# Patient Record
Sex: Female | Born: 1985 | Race: White | Hispanic: No | Marital: Single | Smoking: Never smoker
Health system: Southern US, Community
[De-identification: ages and names within clinical notes are randomized; demographics above are authoritative.]

## PROBLEM LIST (undated history)

## (undated) DIAGNOSIS — I499 Cardiac arrhythmia, unspecified: Secondary | ICD-10-CM

---

## 2013-08-27 ENCOUNTER — Emergency Department (HOSPITAL_COMMUNITY): Payer: Self-pay

## 2013-08-27 ENCOUNTER — Encounter (HOSPITAL_COMMUNITY): Payer: Self-pay | Admitting: Emergency Medicine

## 2013-08-27 ENCOUNTER — Emergency Department (HOSPITAL_COMMUNITY)
Admission: EM | Admit: 2013-08-27 | Discharge: 2013-08-27 | Disposition: A | Discharge status: Home / Self Care | Payer: Self-pay | Attending: Emergency Medicine | Admitting: Emergency Medicine

## 2013-08-27 DIAGNOSIS — Z3202 Encounter for pregnancy test, result negative: Secondary | ICD-10-CM | POA: Insufficient documentation

## 2013-08-27 DIAGNOSIS — E876 Hypokalemia: Secondary | ICD-10-CM

## 2013-08-27 DIAGNOSIS — I493 Ventricular premature depolarization: Secondary | ICD-10-CM

## 2013-08-27 DIAGNOSIS — R002 Palpitations: Secondary | ICD-10-CM

## 2013-08-27 DIAGNOSIS — I4949 Other premature depolarization: Secondary | ICD-10-CM | POA: Insufficient documentation

## 2013-08-27 HISTORY — DX: Cardiac arrhythmia, unspecified: I49.9

## 2013-08-27 LAB — CBC WITH DIFFERENTIAL/PLATELET
Basophils Absolute: 0 10*3/uL (ref 0.0–0.1)
Basophils Relative: 0 % (ref 0–1)
Eosinophils Absolute: 0.2 10*3/uL (ref 0.0–0.7)
Eosinophils Relative: 2 % (ref 0–5)
HCT: 41.1 % (ref 36.0–46.0)
MCH: 26.9 pg (ref 26.0–34.0)
MCHC: 33.3 g/dL (ref 30.0–36.0)
MCV: 80.7 fL (ref 78.0–100.0)
Monocytes Absolute: 0.6 10*3/uL (ref 0.1–1.0)
RDW: 14.3 % (ref 11.5–15.5)

## 2013-08-27 LAB — BASIC METABOLIC PANEL
Calcium: 9.3 mg/dL (ref 8.4–10.5)
Creatinine, Ser: 0.7 mg/dL (ref 0.50–1.10)
GFR calc Af Amer: >90 mL/min (ref >90)

## 2013-08-27 LAB — URINALYSIS, ROUTINE W REFLEX MICROSCOPIC
Bilirubin Urine: NEGATIVE
Ketones, ur: 15 mg/dL — AB
Nitrite: NEGATIVE
Protein, ur: NEGATIVE mg/dL

## 2013-08-27 LAB — URINE MICROSCOPIC-ADD ON

## 2013-08-27 MED ORDER — POTASSIUM CHLORIDE CRYS ER 20 MEQ PO TBCR: 20.0000 meq | EXTENDED_RELEASE_TABLET | Freq: Every day | ORAL | Status: DC

## 2013-08-27 MED ORDER — POTASSIUM CHLORIDE CRYS ER 20 MEQ PO TBCR
40.0000 meq | EXTENDED_RELEASE_TABLET | Freq: Once | ORAL | Status: AC
  Administered 2013-08-27: 40 meq via ORAL
  Filled 2013-08-27: qty 2

## 2013-08-27 NOTE — ED Provider Notes (Cosign Needed)
CSN: [id number]     Arrival date & time 08/27/13  1746 History   First MD Initiated Contact with Patient 08/27/13 1754     Chief Complaint  Patient presents with  . Irregular Heart Beat   (Consider location/radiation/quality/duration/timing/severity/associated sxs/prior Treatment) HPI  Aasha Dina is a 27 y.o. female who is otherwise healthy, complaining of weakness and lightheaded sensation while driving earlier in the day. Associated with the irregular heartbeat sensation she has had this in the past she states that she has been to the emergency room and had an EKG but she was told it was nothing serious. Patient states that she has eaten very little today and had 2 cups of coffee. Patient denies any chest pain, shortness of breath, no family history of early cardiac death, melena, hematochezia, heavy menstruation, alcohol or illicit drug use.   Past Medical History  Diagnosis Date  . Irregular heart beat    History reviewed. No pertinent past surgical history. History reviewed. No pertinent family history. History  Substance Use Topics  . Smoking status: Not on file  . Smokeless tobacco: Not on file  . Alcohol Use: Not on file   OB History   Grav Para Term Preterm Abortions TAB SAB Ect Mult Living                 Review of Systems 10 systems reviewed and found to be negative, except as noted in the HPI  Allergies  Review of patient's allergies indicates not on file.  Home Medications  BP 122/69  Pulse 63  Temp(Src) 99.2 F (37.3 C) (Oral)  Resp 18  SpO2 100%  LMP 08/02/2013 Physical Exam  Nursing note and vitals reviewed. Constitutional: She is oriented to person, place, and time. She appears well-developed and well-nourished. No distress.  HENT:  Head: Normocephalic.  Mouth/Throat: Oropharynx is clear and moist.  Eyes: Conjunctivae and EOM are normal.  Cardiovascular: Normal rate and intact distal pulses.   Generally regular with ectopic beats  Pulmonary/Chest:  Effort normal and breath sounds normal. No stridor. No respiratory distress. She has no wheezes. She has no rales. She exhibits no tenderness.  Abdominal: Soft. Bowel sounds are normal.  Musculoskeletal: Normal range of motion.  Neurological: She is alert and oriented to person, place, and time.  Psychiatric: She has a normal mood and affect.    ED Course  Procedures (including critical care time) Labs Review Labs Reviewed  CBC WITH DIFFERENTIAL - Abnormal; Notable for the following:    WBC 10.9 (*)    Neutro Abs 8.2 (*)    All other components within normal limits  BASIC METABOLIC PANEL - Abnormal; Notable for the following:    Potassium 3.2 (*)    CO2 17 (*)    All other components within normal limits  URINALYSIS, ROUTINE W REFLEX MICROSCOPIC  TSH  POCT PREGNANCY, URINE   Imaging Review Dg Chest 2 View  08/27/2013   *RADIOLOGY REPORT*  Clinical Data: Shortness of breath and irregular heart beat  CHEST - 2 VIEW  Comparison: None.  Findings: Lungs clear.  Heart size and pulmonary vascularity are normal.  No bone lesions.  No adenopathy.  IMPRESSION: No abnormality noted.   Original Report Authenticated By: Bretta Bang, M.D.    Date: 08/28/2013  Rate: 78  Rhythm: normal sinus rhythm with PVC  QRS Axis: right  Intervals: normal  ST/T Wave abnormalities: normal  Conduction Disutrbances:none  Narrative Interpretation:   Old EKG Reviewed: none available  MDM   1. PVC (premature ventricular contraction)   2. Palpitations   3. Hypokalemia     Filed Vitals:   08/27/13 1958 08/27/13 1959 08/27/13 2001 08/27/13 2032  BP: 131/71 134/88 141/85 122/69  Pulse: 80 102 109 63  Temp:    99.2 F (37.3 C)  TempSrc:    Oral  Resp:    18  SpO2:    100%     Quina Wilbourne is a 27 y.o. female second source of palpitations and weakness earlier in the day. EKG shows sinus rhythm with occasional PVC. Blood work is unremarkable except for mild hypokalemia of 3.2. She will be  repleted orally with 40 mEq. Encourage patient to follow as an outpatient for primary care management of the these palpitations and possible Holter monitoring.  This is a shared visit with the attending physician who personally evaluated the patient and agrees with the care plan.   Pt is hemodynamically stable, appropriate for, and amenable to discharge at this time. Pt verbalized understanding and agrees with care plan. All questions answered. Outpatient follow-up and specific return precautions discussed.    Note: Portions of this report may have been transcribed using voice recognition software. Every effort was made to ensure accuracy; however, inadvertent computerized transcription errors may be present      Wynetta Emery, PA-C 08/28/13 0124

## 2013-08-27 NOTE — ED Provider Notes (Signed)
27 year old female presents with a complaint of intermittent palpitations over the course of the last several hours. She describes 2 discreet episodes where she felt like her heart skipped a beat and then there was a positive then it returned to normal after several seconds. She has no chest pain, no back pain, no swelling and denies any ingestion of stimulants.  On exam she is clear heart and lung sounds, occasional ectopy approximately every 30 seconds with a PVC on the monitor. Her EKG is consistent with her clinical exam. She has mild hypokalemia, this will be replaced and she can followup for outpatient Holter monitor testing as needed. She appears hemodynamically stable for discharge at this time. She has expressed her understanding for the indications for return.  Medical screening examination/treatment/procedure(s) were conducted as a shared visit with non-physician practitioner(s) and myself.  I personally evaluated the patient during the encounter.  Clinical Impression: PVC's     Vida Roller, MD 08/27/13 2038

## 2013-08-27 NOTE — ED Notes (Signed)
Patient transported to X-ray 

## 2013-08-27 NOTE — ED Notes (Signed)
Pt c/o dizzyness and weakness while drive. Per ems pt is in bigeminy has hx of irregular HR. No signs of distress.

## 2013-08-29 LAB — URINE CULTURE

## 2014-08-17 IMAGING — CR DG CHEST 2V
2 series · 2 of 2 positions shown · non-contrast
Comparison: None.

CLINICAL DATA: Shortness of breath and irregular heart beat

CHEST - 2 VIEW

[w chest pa]
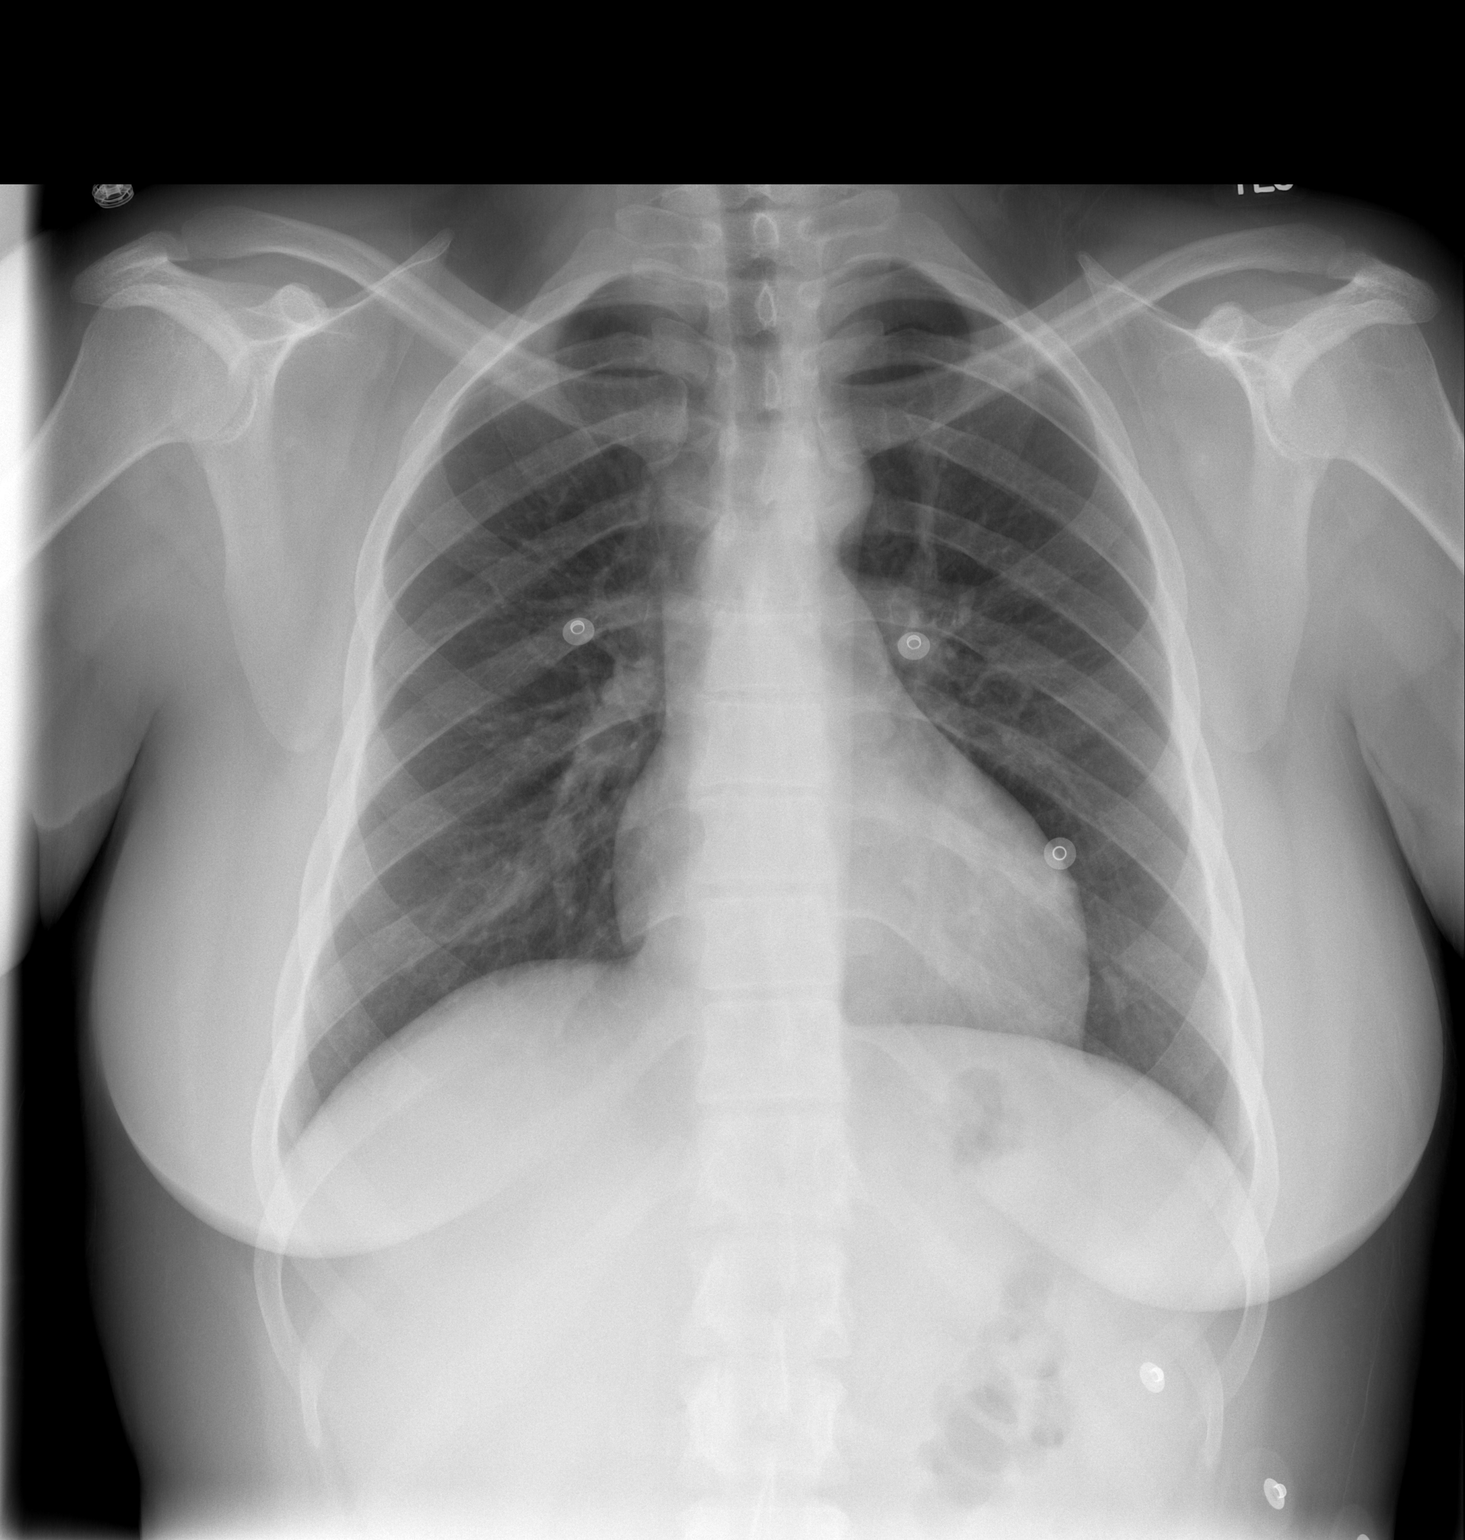

[w chest lat]
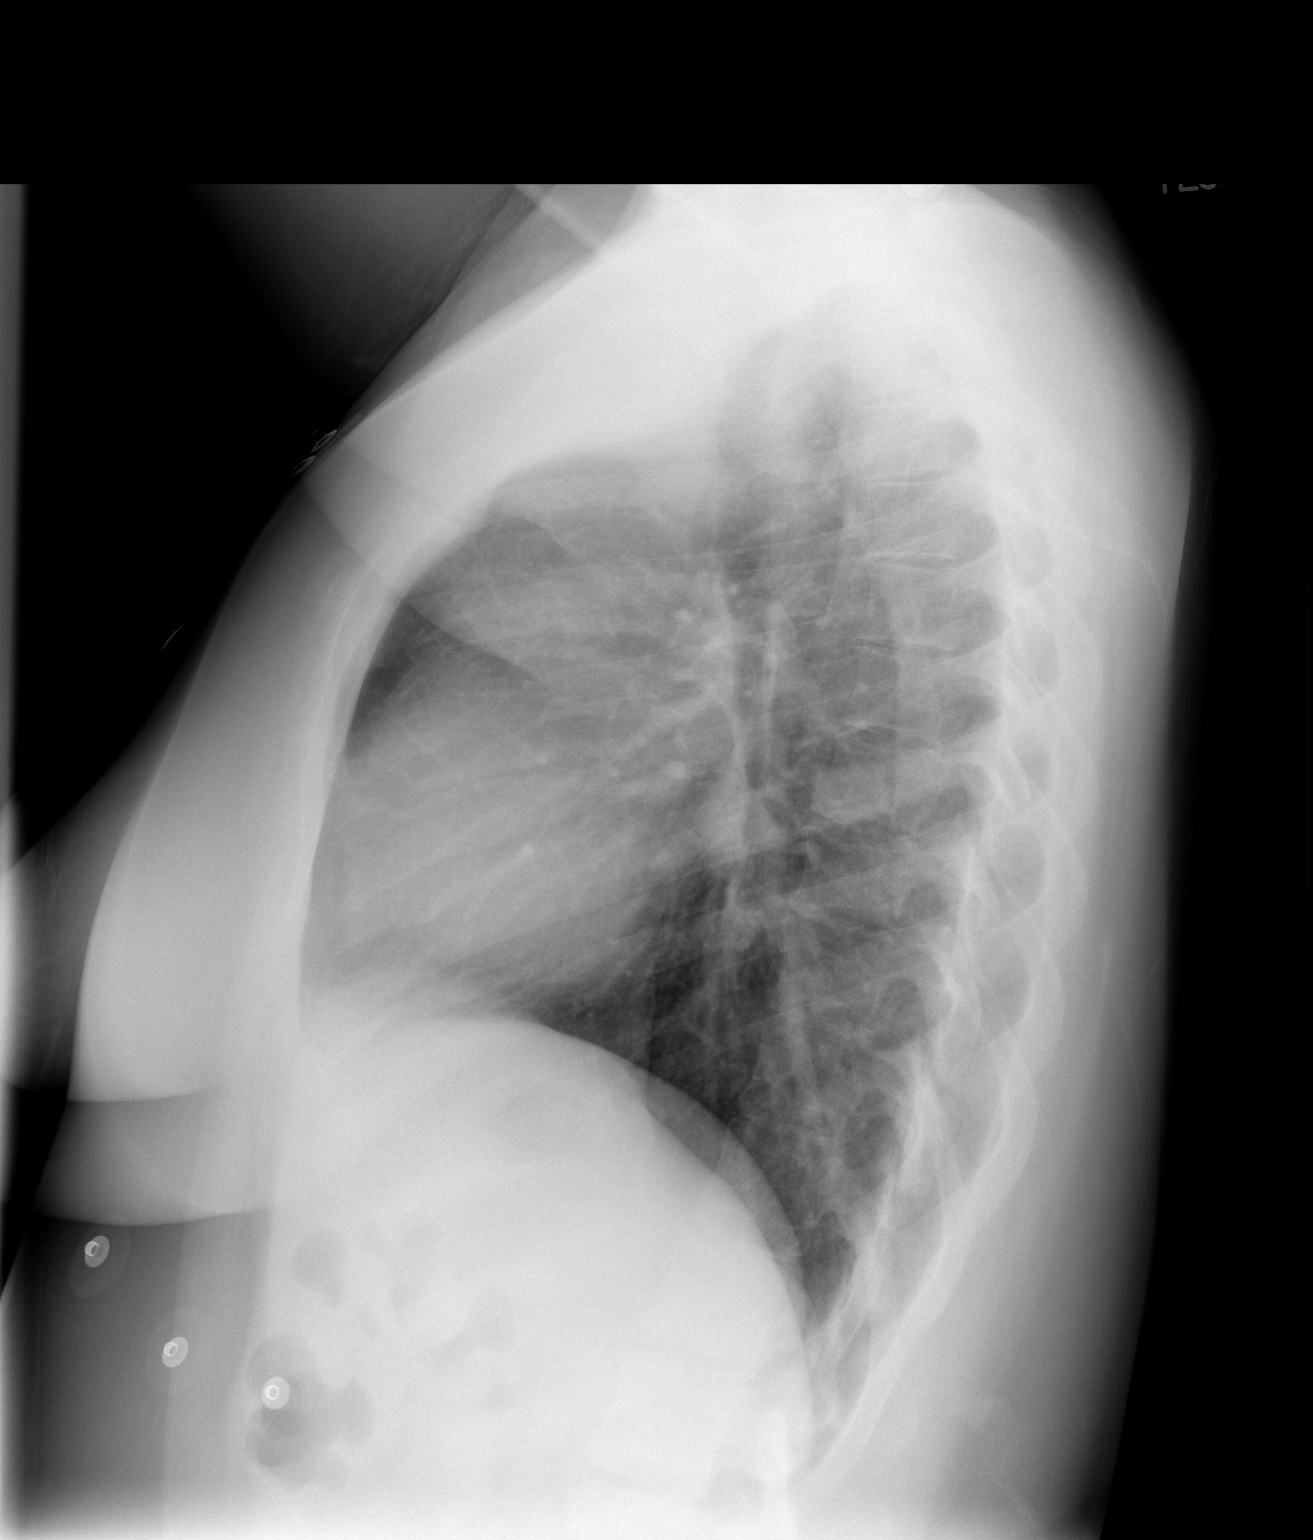

[2 of 2 positions shown; findings below may reference images not displayed]

FINDINGS: Lungs clear.  Heart size and pulmonary vascularity are
normal.  No bone lesions.  No adenopathy.
IMPRESSION: No abnormality noted.

## 2022-01-09 ENCOUNTER — Emergency Department (HOSPITAL_COMMUNITY)
Admission: EM | Admit: 2022-01-09 | Discharge: 2022-01-10 | Discharge status: Against Medical Advice | Payer: BC Managed Care – PPO | Attending: Emergency Medicine | Admitting: Emergency Medicine

## 2022-01-09 ENCOUNTER — Other Ambulatory Visit: Payer: Self-pay

## 2022-01-09 DIAGNOSIS — R42 Dizziness and giddiness: Secondary | ICD-10-CM | POA: Insufficient documentation

## 2022-01-09 DIAGNOSIS — R531 Weakness: Secondary | ICD-10-CM | POA: Insufficient documentation

## 2022-01-09 DIAGNOSIS — Z5321 Procedure and treatment not carried out due to patient leaving prior to being seen by health care provider: Secondary | ICD-10-CM | POA: Diagnosis not present

## 2022-01-09 LAB — URINALYSIS, ROUTINE W REFLEX MICROSCOPIC
Bilirubin Urine: NEGATIVE
Glucose, UA: NEGATIVE mg/dL
Hgb urine dipstick: NEGATIVE
Ketones, ur: 5 mg/dL — AB
Leukocytes,Ua: NEGATIVE
Nitrite: NEGATIVE
Protein, ur: NEGATIVE mg/dL
Specific Gravity, Urine: 1.003 — ABNORMAL LOW (ref 1.005–1.030)
pH: 7 (ref 5.0–8.0)

## 2022-01-09 LAB — BASIC METABOLIC PANEL
Anion gap: 9 (ref 5–15)
BUN: 13 mg/dL (ref 6–20)
CO2: 23 mmol/L (ref 22–32)
Calcium: 9.4 mg/dL (ref 8.9–10.3)
Chloride: 108 mmol/L (ref 98–111)
Creatinine, Ser: 0.97 mg/dL (ref 0.44–1.00)
GFR, Estimated: >60 mL/min (ref >60)
Glucose, Bld: 92 mg/dL (ref 70–99)
Potassium: 3.3 mmol/L — ABNORMAL LOW (ref 3.5–5.1)
Sodium: 140 mmol/L (ref 135–145)

## 2022-01-09 LAB — CBC
HCT: 44.3 % (ref 36.0–46.0)
Hemoglobin: 13.5 g/dL (ref 12.0–15.0)
MCH: 24.5 pg — ABNORMAL LOW (ref 26.0–34.0)
MCHC: 30.5 g/dL (ref 30.0–36.0)
MCV: 80.3 fL (ref 80.0–100.0)
Platelets: 293 10*3/uL (ref 150–400)
RBC: 5.52 MIL/uL — ABNORMAL HIGH (ref 3.87–5.11)
RDW: 14.4 % (ref 11.5–15.5)
WBC: 10.9 10*3/uL — ABNORMAL HIGH (ref 4.0–10.5)
nRBC: 0 % (ref 0.0–0.2)

## 2022-01-09 LAB — CBG MONITORING, ED: Glucose-Capillary: 91 mg/dL (ref 70–99)

## 2022-01-09 LAB — I-STAT BETA HCG BLOOD, ED (MC, WL, AP ONLY): I-stat hCG, quantitative: <5 m[IU]/mL (ref <5)

## 2022-01-09 NOTE — ED Notes (Signed)
Pt called for a room and stated to me that she was going to leave that she has already called her ride

## 2022-01-09 NOTE — ED Provider Triage Note (Signed)
Emergency Medicine Provider Triage Evaluation Note  Andrea Parrish , a 36 y.o. female  was evaluated in triage.  Pt complains of lightheadedness and dizziness. She states that same started today and she had a brief episode where she blacked out while driving earlier. She denies any prodrome proceeding her syncopal episode. Denies nausea, vomiting, or diarrhea. She is pain free. No cardiac hx. Does state that she has had hypertension for the past year but is not on medication  Review of Systems  Positive: Weakness, lightheadedness Negative: Headache, blurred vision, n/v/d  Physical Exam  BP 139/90 (BP Location: Left Arm)    Pulse 86    Temp 98 F (36.7 C) (Oral)    Resp 18    SpO2 97%  Gen:   Awake, no distress   Resp:  Normal effort  MSK:   Moves extremities without difficulty  Other:    Medical Decision Making  Medically screening exam initiated at 6:35 PM.  Appropriate orders placed.  Jase Himmelberger was informed that the remainder of the evaluation will be completed by another provider, this initial triage assessment does not replace that evaluation, and the importance of remaining in the ED until their evaluation is complete.     Vear Clock 01/09/22 1839

## 2022-01-09 NOTE — ED Triage Notes (Signed)
Patient BIB EMS from home c/o dizziness and lightheadedness.  Per report pt got brief black out while driving and then got weak when she got home. Patient denies N/V. Pt a/ox4.  BP 172/92 HR 58 RR 20 O2 sat 100% on RA

## 2024-04-14 ENCOUNTER — Encounter (HOSPITAL_COMMUNITY): Payer: Self-pay | Admitting: Emergency Medicine

## 2024-04-14 ENCOUNTER — Emergency Department (HOSPITAL_COMMUNITY)

## 2024-04-14 ENCOUNTER — Observation Stay (HOSPITAL_COMMUNITY)
Admission: EM | Admit: 2024-04-14 | Discharge: 2024-04-16 | Disposition: A | Discharge status: Home / Self Care | Attending: General Surgery | Admitting: General Surgery

## 2024-04-14 ENCOUNTER — Other Ambulatory Visit: Payer: Self-pay

## 2024-04-14 ENCOUNTER — Inpatient Hospital Stay (HOSPITAL_COMMUNITY)

## 2024-04-14 DIAGNOSIS — K810 Acute cholecystitis: Secondary | ICD-10-CM | POA: Diagnosis not present

## 2024-04-14 DIAGNOSIS — E8881 Metabolic syndrome: Secondary | ICD-10-CM | POA: Diagnosis not present

## 2024-04-14 DIAGNOSIS — D509 Iron deficiency anemia, unspecified: Secondary | ICD-10-CM | POA: Diagnosis present

## 2024-04-14 DIAGNOSIS — R1011 Right upper quadrant pain: Secondary | ICD-10-CM | POA: Diagnosis present

## 2024-04-14 DIAGNOSIS — Z7901 Long term (current) use of anticoagulants: Secondary | ICD-10-CM | POA: Insufficient documentation

## 2024-04-14 DIAGNOSIS — N920 Excessive and frequent menstruation with regular cycle: Secondary | ICD-10-CM

## 2024-04-14 DIAGNOSIS — J45909 Unspecified asthma, uncomplicated: Secondary | ICD-10-CM | POA: Diagnosis not present

## 2024-04-14 DIAGNOSIS — K8012 Calculus of gallbladder with acute and chronic cholecystitis without obstruction: Principal | ICD-10-CM | POA: Insufficient documentation

## 2024-04-14 DIAGNOSIS — I499 Cardiac arrhythmia, unspecified: Secondary | ICD-10-CM | POA: Diagnosis present

## 2024-04-14 DIAGNOSIS — F418 Other specified anxiety disorders: Secondary | ICD-10-CM | POA: Diagnosis not present

## 2024-04-14 DIAGNOSIS — R7303 Prediabetes: Secondary | ICD-10-CM | POA: Diagnosis present

## 2024-04-14 DIAGNOSIS — I498 Other specified cardiac arrhythmias: Secondary | ICD-10-CM | POA: Diagnosis not present

## 2024-04-14 DIAGNOSIS — E785 Hyperlipidemia, unspecified: Secondary | ICD-10-CM | POA: Diagnosis not present

## 2024-04-14 DIAGNOSIS — D500 Iron deficiency anemia secondary to blood loss (chronic): Secondary | ICD-10-CM

## 2024-04-14 LAB — LIPID PANEL
Cholesterol: 219 mg/dL — ABNORMAL HIGH (ref 0–200)
HDL: 48 mg/dL (ref >40)
LDL Cholesterol: 157 mg/dL — ABNORMAL HIGH (ref 0–99)
Total CHOL/HDL Ratio: 4.6 ratio
Triglycerides: 68 mg/dL (ref <150)
VLDL: 14 mg/dL (ref 0–40)

## 2024-04-14 LAB — IRON AND TIBC
Iron: 18 ug/dL — ABNORMAL LOW (ref 28–170)
Saturation Ratios: 5 % — ABNORMAL LOW (ref 10.4–31.8)
TIBC: 395 ug/dL (ref 250–450)
UIBC: 377 ug/dL

## 2024-04-14 LAB — COMPREHENSIVE METABOLIC PANEL WITH GFR
ALT: 13 U/L (ref 0–44)
AST: 17 U/L (ref 15–41)
Albumin: 3.3 g/dL — ABNORMAL LOW (ref 3.5–5.0)
Alkaline Phosphatase: 57 U/L (ref 38–126)
Anion gap: 7 (ref 5–15)
BUN: 16 mg/dL (ref 6–20)
CO2: 26 mmol/L (ref 22–32)
Calcium: 8.7 mg/dL — ABNORMAL LOW (ref 8.9–10.3)
Chloride: 103 mmol/L (ref 98–111)
Creatinine, Ser: 0.89 mg/dL (ref 0.44–1.00)
GFR, Estimated: >60 mL/min (ref >60)
Glucose, Bld: 100 mg/dL — ABNORMAL HIGH (ref 70–99)
Potassium: 3.6 mmol/L (ref 3.5–5.1)
Sodium: 136 mmol/L (ref 135–145)
Total Bilirubin: 0.4 mg/dL (ref 0.0–1.2)
Total Protein: 6.9 g/dL (ref 6.5–8.1)

## 2024-04-14 LAB — RETICULOCYTES
Immature Retic Fract: 20.8 % — ABNORMAL HIGH (ref 2.3–15.9)
RBC.: 4.9 MIL/uL (ref 3.87–5.11)
Retic Count, Absolute: 81.3 10*3/uL (ref 19.0–186.0)
Retic Ct Pct: 1.7 % (ref 0.4–3.1)

## 2024-04-14 LAB — CBC
HCT: 38.3 % (ref 36.0–46.0)
Hemoglobin: 11.6 g/dL — ABNORMAL LOW (ref 12.0–15.0)
MCH: 24.1 pg — ABNORMAL LOW (ref 26.0–34.0)
MCHC: 30.3 g/dL (ref 30.0–36.0)
MCV: 79.5 fL — ABNORMAL LOW (ref 80.0–100.0)
Platelets: 260 10*3/uL (ref 150–400)
RBC: 4.82 MIL/uL (ref 3.87–5.11)
RDW: 14.6 % (ref 11.5–15.5)
WBC: 9.1 10*3/uL (ref 4.0–10.5)
nRBC: 0 % (ref 0.0–0.2)

## 2024-04-14 LAB — URINALYSIS, ROUTINE W REFLEX MICROSCOPIC
Bacteria, UA: NONE SEEN
Bilirubin Urine: NEGATIVE
Glucose, UA: NEGATIVE mg/dL
Hgb urine dipstick: NEGATIVE
Ketones, ur: NEGATIVE mg/dL
Nitrite: NEGATIVE
Protein, ur: NEGATIVE mg/dL
Specific Gravity, Urine: 1.013 (ref 1.005–1.030)
pH: 5 (ref 5.0–8.0)

## 2024-04-14 LAB — FERRITIN: Ferritin: 4 ng/mL — ABNORMAL LOW (ref 11–307)

## 2024-04-14 LAB — HEMOGLOBIN A1C
Hgb A1c MFr Bld: 4.8 % (ref 4.8–5.6)
Mean Plasma Glucose: 91.06 mg/dL

## 2024-04-14 LAB — LIPASE, BLOOD: Lipase: 30 U/L (ref 11–51)

## 2024-04-14 LAB — POC URINE PREG, ED: Preg Test, Ur: NEGATIVE

## 2024-04-14 LAB — HIV ANTIBODY (ROUTINE TESTING W REFLEX): HIV Screen 4th Generation wRfx: NONREACTIVE

## 2024-04-14 MED ORDER — CHLORHEXIDINE GLUCONATE CLOTH 2 % EX PADS
6.0000 | MEDICATED_PAD | Freq: Once | CUTANEOUS | Status: AC
  Administered 2024-04-14: 6 via TOPICAL

## 2024-04-14 MED ORDER — SODIUM CHLORIDE 0.9 % IV SOLN: INTRAVENOUS | Status: DC

## 2024-04-14 MED ORDER — ACETAMINOPHEN 325 MG PO TABS: 650.0000 mg | ORAL_TABLET | Freq: Four times a day (QID) | ORAL | Status: DC | PRN

## 2024-04-14 MED ORDER — SODIUM CHLORIDE 0.9 % IV SOLN
1.0000 g | Freq: Once | INTRAVENOUS | Status: AC
  Administered 2024-04-14: 1 g via INTRAVENOUS
  Filled 2024-04-14: qty 10

## 2024-04-14 MED ORDER — TRAZODONE HCL 50 MG PO TABS
50.0000 mg | ORAL_TABLET | Freq: Every evening | ORAL | Status: DC | PRN
  Administered 2024-04-15: 50 mg via ORAL
  Filled 2024-04-14: qty 1

## 2024-04-14 MED ORDER — INDOCYANINE GREEN 25 MG IV SOLR
2.5000 mg | Freq: Once | INTRAVENOUS | Status: AC
  Administered 2024-04-15: 2.5 mg via INTRAVENOUS
  Filled 2024-04-14: qty 10

## 2024-04-14 MED ORDER — SODIUM CHLORIDE 0.9 % IV SOLN: 2.0000 g | INTRAVENOUS | Status: DC

## 2024-04-14 MED ORDER — BISACODYL 5 MG PO TBEC: 5.0000 mg | DELAYED_RELEASE_TABLET | Freq: Every day | ORAL | Status: DC | PRN

## 2024-04-14 MED ORDER — ACETAMINOPHEN 650 MG RE SUPP: 650.0000 mg | Freq: Four times a day (QID) | RECTAL | Status: DC | PRN

## 2024-04-14 MED ORDER — CHLORHEXIDINE GLUCONATE CLOTH 2 % EX PADS: 6.0000 | MEDICATED_PAD | Freq: Once | CUTANEOUS | Status: DC

## 2024-04-14 MED ORDER — ONDANSETRON HCL 4 MG/2ML IJ SOLN
4.0000 mg | Freq: Four times a day (QID) | INTRAMUSCULAR | Status: DC | PRN
Start: 2024-04-14 — End: 2024-04-16

## 2024-04-14 MED ORDER — HEPARIN SODIUM (PORCINE) 5000 UNIT/ML IJ SOLN
5000.0000 [IU] | Freq: Three times a day (TID) | INTRAMUSCULAR | Status: DC
  Administered 2024-04-15 – 2024-04-16 (×2): 5000 [IU] via SUBCUTANEOUS
  Filled 2024-04-14 (×2): qty 1

## 2024-04-14 MED ORDER — IOHEXOL 300 MG/ML  SOLN
100.0000 mL | Freq: Once | INTRAMUSCULAR | Status: AC | PRN
  Administered 2024-04-14: 100 mL via INTRAVENOUS

## 2024-04-14 MED ORDER — ALBUTEROL SULFATE (2.5 MG/3ML) 0.083% IN NEBU: 2.5000 mg | INHALATION_SOLUTION | RESPIRATORY_TRACT | Status: DC | PRN

## 2024-04-14 MED ORDER — METRONIDAZOLE 500 MG/100ML IV SOLN
500.0000 mg | Freq: Once | INTRAVENOUS | Status: AC
Start: 2024-04-14 — End: 2024-04-14
  Administered 2024-04-14: 500 mg via INTRAVENOUS
  Filled 2024-04-14: qty 100

## 2024-04-14 MED ORDER — OXYCODONE HCL 5 MG PO TABS
5.0000 mg | ORAL_TABLET | ORAL | Status: DC | PRN
  Administered 2024-04-15: 5 mg via ORAL
  Filled 2024-04-14 (×2): qty 1

## 2024-04-14 MED ORDER — ONDANSETRON HCL 4 MG PO TABS
4.0000 mg | ORAL_TABLET | Freq: Four times a day (QID) | ORAL | Status: DC | PRN
Start: 2024-04-14 — End: 2024-04-16

## 2024-04-14 MED ORDER — FENTANYL CITRATE PF 50 MCG/ML IJ SOSY: 25.0000 ug | PREFILLED_SYRINGE | INTRAMUSCULAR | Status: DC | PRN

## 2024-04-14 MED ORDER — METRONIDAZOLE 500 MG/100ML IV SOLN
500.0000 mg | Freq: Two times a day (BID) | INTRAVENOUS | Status: DC
  Administered 2024-04-14: 500 mg via INTRAVENOUS
  Filled 2024-04-14: qty 100

## 2024-04-14 MED ORDER — SODIUM CHLORIDE 0.9 % IV SOLN
2.0000 g | INTRAVENOUS | Status: AC
  Administered 2024-04-15: 2 g via INTRAVENOUS
  Filled 2024-04-14 (×2): qty 2

## 2024-04-14 NOTE — H&P (Addendum)
 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  She denied nausea and vomiting symptoms.  Patient denied fever and chills.  She reported that the pain has been constant.  Her labs were unremarkable.  She was sent for CT of the abdomen and pelvis with findings of multiple gallstones and a thickened gallbladder wall consistent with early cholecystitis.  General surgery was consulted who recommended patient be kept n.p.o. started on antibiotics and they will consult.    Past Medical History:  Diagnosis Date   Irregular heart beat     History reviewed. No pertinent surgical history.   reports that she has never smoked. She does not have any smokeless tobacco history on file. She reports that she does not currently use alcohol. She reports that she does not currently use drugs.  Allergies  Allergen Reactions   Aspirin Hives    Most over the counter, pill form pain killers.    History reviewed. No pertinent family history.  Prior to Admission medications   Medication Sig Start Date End Date Taking? Authorizing Provider  potassium chloride  SA (K-DUR,KLOR-CON ) 20 MEQ tablet Take 1 tablet (20 mEq total) by mouth daily. 08/27/13   Pisciotta, Peterson Brandt, PA-C    Physical Exam: Vitals:   04/14/24 0239 04/14/24 0600  BP: (!)  149/69 117/76  Pulse: 63 (!) 58  Resp: 20 18  Temp: 97.6 F (36.4 C)   TempSrc: Oral   SpO2: 99% 98%  Weight: 112 kg   Height: 5\' 6"  (1.676 m)     Constitutional: NAD, calm, uncomfortable Eyes: PERRL, lids and conjunctivae normal ENMT: Mucous membranes are moist. Posterior pharynx clear of any exudate or lesions.Normal dentition.  Neck: normal, supple, no masses, no thyromegaly Respiratory: clear to auscultation bilaterally, no wheezing, no crackles. Normal respiratory effort. No accessory muscle use.  Cardiovascular: normal s1, s2 sounds, no murmurs / rubs / gallops. No extremity edema. 2+ pedal pulses. No carotid bruits.  Abdomen: RUQ tenderness with light palpation, no masses palpated. No hepatosplenomegaly. Bowel sounds positive.  Musculoskeletal: no clubbing / cyanosis. No joint deformity upper and lower extremities. Good ROM, no contractures. Normal muscle tone.  Skin: no rashes, lesions, ulcers. No induration Neurologic: CN 2-12 grossly intact. Sensation intact, DTR normal. Strength 5/5 in all 4.  Psychiatric: Normal judgment and insight. Alert and oriented x 3. Normal mood.   Labs on Admission: I have personally reviewed following labs and imaging studies  CBC: Recent Labs  Lab 04/14/24 0243  WBC 9.1  HGB 11.6*  HCT 38.3  MCV 79.5*  PLT 260   Basic Metabolic Panel: Recent Labs  Lab 04/14/24 0243  NA 136  K 3.6  CL 103  CO2 26  GLUCOSE 100*  BUN 16  CREATININE 0.89  CALCIUM 8.7*   GFR: Estimated Creatinine Clearance: 108.8 mL/min (by C-G formula based on SCr of 0.89 mg/dL). Liver Function Tests: Recent Labs  Lab 04/14/24 0243  AST 17  ALT 13  ALKPHOS 57  BILITOT 0.4  PROT 6.9  ALBUMIN 3.3*   Recent Labs  Lab 04/14/24 0243  LIPASE 30   No results for input(s): "AMMONIA" in the last 168 hours. Coagulation Profile: No results for input(s): "INR", "PROTIME" in the last 168 hours. Cardiac Enzymes: No results for input(s): "CKTOTAL", "CKMB",  "CKMBINDEX", "TROPONINI" in the last 168 hours. BNP (last 3 results) No results for input(s): "PROBNP" in the last 8760 hours. HbA1C: No results for input(s): "HGBA1C" in the last 72 hours. CBG: No results for input(s): "GLUCAP" in the last 168 hours. Lipid Profile: No results for input(s): "CHOL", "HDL", "LDLCALC", "TRIG", "CHOLHDL", "LDLDIRECT" in the last 72 hours. Thyroid Function Tests: No results for input(s): "TSH", "T4TOTAL", "FREET4", "T3FREE", "THYROIDAB" in the last 72 hours. Anemia Panel: No results for input(s): "VITAMINB12", "FOLATE", "FERRITIN", "TIBC", "IRON", "RETICCTPCT" in the last 72 hours. Urine analysis:    Component Value Date/Time   COLORURINE STRAW (A) 04/14/2024 0243   APPEARANCEUR CLEAR 04/14/2024 0243   LABSPEC 1.013 04/14/2024 0243   PHURINE 5.0 04/14/2024 0243   GLUCOSEU NEGATIVE 04/14/2024 0243   HGBUR NEGATIVE 04/14/2024 0243   BILIRUBINUR NEGATIVE 04/14/2024 0243   KETONESUR NEGATIVE 04/14/2024 0243   PROTEINUR NEGATIVE 04/14/2024 0243   UROBILINOGEN 0.2 08/27/2013 1845   NITRITE NEGATIVE 04/14/2024 0243   LEUKOCYTESUR SMALL (A) 04/14/2024 0243    Radiological Exams on Admission: CT ABDOMEN PELVIS W CONTRAST Result Date: 04/14/2024 CLINICAL DATA:  Right abdominal pain EXAM: CT ABDOMEN AND PELVIS WITH CONTRAST TECHNIQUE: Multidetector CT imaging of the abdomen and pelvis was performed using the standard protocol following bolus administration of intravenous contrast. RADIATION DOSE REDUCTION: This exam was performed according to the departmental dose-optimization program which includes automated exposure control, adjustment of the mA and/or kV according to patient size and/or use of iterative reconstruction technique. CONTRAST:  100mL OMNIPAQUE IOHEXOL 300 MG/ML  SOLN COMPARISON:  None Available. FINDINGS: Lower chest: Lung bases are clear. Hepatobiliary: Liver is within normal limits. Cholelithiasis, including a gallstone in the gallbladder neck (image  26), with mild gallbladder wall thickening/edema (image 38). No convincing pericholecystic inflammatory changes. No intrahepatic or extrahepatic ductal dilatation. Pancreas: Within normal limits. Spleen: Within normal limits Adrenals/Urinary Tract: Adrenal glands are within normal limits. Kidneys with normal is.  No hydronephrosis. Bladder is within normal limits. Stomach/Bowel: Stomach is notable for a small hiatal hernia. No evidence of bowel obstruction. Normal appendix (image 71). No colonic wall thickening or inflammatory changes. Vascular/Lymphatic: No evidence of abdominal aortic aneurysm. No suspicious abdominopelvic lymphadenopathy. Reproductive: Uterus and bilateral ovaries are within normal limits. Other: Small volume pelvic ascites. Musculoskeletal: Visualized osseous structures are within normal limits. IMPRESSION: Cholelithiasis with mild gallbladder wall thickening/edema. No convincing pericholecystic inflammatory changes. However, this appearance raises concern for early acute cholecystitis. Consider right upper quadrant ultrasound for further evaluation as clinically warranted. Electronically Signed   By: Zadie Herter M.D.   On: 04/14/2024 03:53    EKG: Independently reviewed. NSR  Assessment/Plan Principal Problem:   Acute cholecystitis Active Problems:   Irregular heart beat   Ventricular bigeminy   Iron deficiency anemia   Menorrhagia with regular cycle   Anxiety with depression   Metabolic syndrome X   Prediabetes    Acute Cholecystitis - maintain NPO status for now until surgery consultation - continue IV fluids - continue IV antibiotics with ceftriaxone and metronidazole - IV pain and nausea medicine ordered - Further recommendations pending surgery  consultation - Defer ultrasound and other diagnostic testing to surgery team  RUQ abdominal pain  - continue IV pain and nausea medications as ordered   History of ventricular bigeminy - Stable takes diltiazem  daily - EKG is reassuring with normal sinus rhythm  Iron Deficiency Anemia  - Hg reassuring at 11.6 today - MCV 79.5  Menorrhagia - Pt noted to have heavy menstrual periods lasting 7 days - She is on iron supplements and followed by PCP  Hyperlipidemia - Reportedly has been diet controlled - Check lipid panel  Reactive airway disease - Patient reportedly uses albuterol inhaler as needed during seasonal allergies - Patient reportedly takes cetirizine daily throughout spring and summer for seasonal allergies  Anxiety and depression -Patient is taking Lexapro recently adjusted by PCP from 10 to 20 mg -Patient also taking Wellbutrin 150 mg XR in addition to Lexapro  Metabolic Syndrome X - reportedly has had elevated blood sugars and A1c in the prediabetic range  DVT prophylaxis: sq heparin   Code Status: Full   Family Communication:   Disposition Plan: anticipate home   Consults called: surgery   Admission status: INP Time spent: 45 mins  Level of care: Med-Surg Faustino Hook MD [facility] How to contact the [facility] Attending or Consulting provider 7A - 7P or covering provider during after hours 7P -7A, for this patient?  Check the care team in [facility] and look for a) attending/consulting [facility] provider listed and b) the [facility] team listed Log into [email or web address] and use [facility] universal password to access. If you do not have the password, please contact the hospital operator. Locate the [facility] provider you are looking for under [facility] and page to a number that you can be directly reached. If you still have difficulty reaching the provider, please page the [facility] (Director on Call) for the Hospitalists listed on amion for assistance.   If 7PM-7AM, please contact night-coverage [email or web address] Password [facility]  04/14/2024, 7:37 AM

## 2024-04-14 NOTE — ED Notes (Signed)
 Dr Larrie Po paged to Dr Lula Sale

## 2024-04-14 NOTE — Consult Note (Signed)
 [facility] Consult  Reason for Consult: Acute cholecystitis  Referring Physician:  Dr. Lincoln Renshaw   Chief Complaint   Abdominal Pain     HPI: Andrea Parrish is a 38 y.o. female with iron deficiency anemia, anxiety, depression, HTN, hyperlipidemia, reactive airway and menorrhagia, ventricular bigmeniny. She came in with RUQ pain that started at 10pm.  She did have an episode of vomiting up acid but no food. She says the pain is constant and continues.  She has had a C section in the past but no other abdominal surgeries.   Past Medical History:  Diagnosis Date   Irregular heart beat     Past Surgical History:  Procedure Laterality Date   CESAREAN SECTION      History reviewed. No pertinent family history.  Social History   Tobacco Use   Smoking status: Never  Substance Use Topics   Alcohol use: Not Currently   Drug use: Not Currently    Medications: I have reviewed the patient's current medications. Prior to Admission:  Medications Prior to Admission  Medication Sig Dispense Refill Last Dose/Taking   albuterol (VENTOLIN HFA) 108 (90 Base) MCG/ACT inhaler INHALE 1 PUFF BY MOUTH EVERY 4 HOURS AS NEEDED   Taking   buPROPion (WELLBUTRIN XL) 150 MG 24 hr tablet Take 1 tablet by mouth every morning.   Taking   diltiazem (CARDIZEM CD) 120 MG 24 hr capsule Take 120 mg by mouth daily.   Taking   diltiazem (DILT-XR) 120 MG 24 hr capsule TAKE 1 CAPSULE BY MOUTH ONCE DAILY AS DIRECTED FOR 90 DAYS   Taking   escitalopram (LEXAPRO) 20 MG tablet Take 1 tablet by mouth daily.   Taking   fluticasone (FLONASE) 50 MCG/ACT nasal spray 1-2 sprays in each nostril daily   Taking   cetirizine (ZYRTEC) 10 MG tablet Take 10 mg by mouth.      Ferrous Sulfate (IRON Parrish) Take 1 tablet by mouth.      omeprazole (PRILOSEC OTC) 20 MG tablet Take 1 tablet by mouth daily.      potassium chloride  SA (K-DUR,KLOR-CON ) 20 MEQ tablet Take 1 tablet (20 mEq total) by mouth daily. 4 tablet 0     Prenatal Vit-DSS-Fe Fum-FA (PRENATAL 19) tablet Take 1 tablet by mouth daily.      Scheduled:  Chlorhexidine Gluconate Cloth  6 each Topical Once   And   Chlorhexidine Gluconate Cloth  6 each Topical Once   [START ON 04/15/2024] heparin  5,000 Units Subcutaneous Q8H   [START ON 04/15/2024] indocyanine green  2.5 mg Intravenous Once   Continuous:  sodium chloride 125 mL/hr at 04/14/24 0559   [START ON 04/15/2024] cefoTEtan (CEFOTAN) IV     [START ON 04/15/2024] cefTRIAXone (ROCEPHIN)  IV     metronidazole     YQI:[id number] **OR** acetaminophen, albuterol, bisacodyl, fentaNYL (SUBLIMAZE) injection, ondansetron **OR** ondansetron (ZOFRAN) IV, oxyCODONE, traZODone  Allergies  Allergen Reactions   Aspirin Hives    Most over the counter, pill form pain killers.     ROS:  A comprehensive review of systems was negative except for: Gastrointestinal: positive for abdominal pain  Blood pressure 120/65, pulse (!) 50, temperature 98.1 F (36.7 C), resp. rate 18, height 5\' 6"  (1.676 m), weight 112 kg, last menstrual period 03/31/2024, SpO2 100%. Physical Exam Vitals reviewed.  HENT:     Head: Normocephalic.  Eyes:     Extraocular Movements: Extraocular movements intact.  Cardiovascular:     Rate and Rhythm: Normal rate.  Pulmonary:     Breath sounds: Normal breath sounds.  Abdominal:     General: There is no distension.     Palpations: Abdomen is soft.     Tenderness: There is abdominal tenderness in the right upper quadrant and epigastric area.  Musculoskeletal:     Comments: Moves all extremities   Skin:    General: Skin is warm.  Neurological:     General: No focal deficit present.     Mental Status: She is alert and oriented to person, place, and time.  Psychiatric:        Mood and Affect: Mood normal.     Results: Results for orders placed or performed during the hospital encounter of 04/14/24 (from the past 48 hours)  Lipase, blood     Status: None   Collection Time:  04/14/24  2:43 AM  Result Value Ref Range   Lipase 30 11 - 51 U/L    Comment: Performed at [facility], [address], [place], [place] [address]  Comprehensive metabolic panel     Status: Abnormal   Collection Time: 04/14/24  2:43 AM  Result Value Ref Range   Sodium 136 135 - 145 mmol/L   Potassium 3.6 3.5 - 5.1 mmol/L   Chloride 103 98 - 111 mmol/L   CO2 26 22 - 32 mmol/L   Glucose, Bld 100 (H) 70 - 99 mg/dL    Comment: Glucose reference range applies only to samples taken after fasting for at least 8 hours.   BUN 16 6 - 20 mg/dL   Creatinine, Ser 6.04 0.44 - 1.00 mg/dL   Calcium 8.7 (L) 8.9 - 10.3 mg/dL   Total Protein 6.9 6.5 - 8.1 g/dL   Albumin 3.3 (L) 3.5 - 5.0 g/dL   AST 17 15 - 41 U/L   ALT 13 0 - 44 U/L   Alkaline Phosphatase 57 38 - 126 U/L   Total Bilirubin 0.4 0.0 - 1.2 mg/dL   GFR, Estimated >54 >09 mL/min    Comment: (NOTE) Calculated using the CKD-EPI Creatinine Equation (2021)    Anion gap 7 5 - 15    Comment: Performed at [facility], [address], [place], [place] [address]  CBC     Status: Abnormal   Collection Time: 04/14/24  2:43 AM  Result Value Ref Range   WBC 9.1 4.0 - 10.5 K/uL   RBC 4.82 3.87 - 5.11 MIL/uL   Hemoglobin 11.6 (L) 12.0 - 15.0 g/dL   HCT 47.8 29.5 - 62.1 %   MCV 79.5 (L) 80.0 - 100.0 fL   MCH 24.1 (L) 26.0 - 34.0 pg   MCHC 30.3 30.0 - 36.0 g/dL   RDW 30.8 65.7 - 84.6 %   Platelets 260 150 - 400 K/uL   nRBC 0.0 0.0 - 0.2 %    Comment: Performed at [facility], [address] [place], [place] [address]  Urinalysis, Routine w reflex microscopic -Urine, Clean Catch     Status: Abnormal   Collection Time: 04/14/24  2:43 AM  Result Value Ref Range   Color, Urine STRAW (A) YELLOW   APPearance CLEAR CLEAR   Specific Gravity, Urine 1.013 1.005 - 1.030   pH 5.0 5.0 - 8.0   Glucose, UA NEGATIVE NEGATIVE mg/dL   Hgb urine dipstick NEGATIVE NEGATIVE   Bilirubin Urine NEGATIVE NEGATIVE   Ketones, ur NEGATIVE NEGATIVE mg/dL   Protein,  ur NEGATIVE NEGATIVE mg/dL   Nitrite NEGATIVE NEGATIVE   Leukocytes,Ua SMALL (A)  NEGATIVE   RBC / HPF 0-5 0 - 5 RBC/hpf   WBC, UA 0-5 0 - 5 WBC/hpf   Bacteria, UA NONE SEEN NONE SEEN   Squamous Epithelial / HPF 0-5 0 - 5 /HPF    Comment: Performed at [facility], [address], [place], [place] [address]  POC urine preg, ED     Status: None   Collection Time: 04/14/24  2:55 AM  Result Value Ref Range   Preg Test, Ur Negative Negative  Lipid panel     Status: Abnormal   Collection Time: 04/14/24  6:00 AM  Result Value Ref Range   Cholesterol 219 (H) 0 - 200 mg/dL   Triglycerides 68 <604 mg/dL   HDL 48 >54 mg/dL   Total CHOL/HDL Ratio 4.6 RATIO   VLDL 14 0 - 40 mg/dL   LDL Cholesterol 098 (H) 0 - 99 mg/dL    Comment:        Total Cholesterol/HDL:CHD Risk Coronary Heart Disease Risk Table                     Men   Women  1/2 Average Risk   3.4   3.3  Average Risk       5.0   4.4  2 X Average Risk   9.6   7.1  3 X Average Risk  23.4   11.0        Use the calculated Patient Ratio above and the CHD Risk Table to determine the patient's CHD Risk.        ATP III CLASSIFICATION (LDL):  <100     mg/dL   Optimal  119-147  mg/dL   Near or Above                    Optimal  130-159  mg/dL   Borderline  829-562  mg/dL   High  >130     mg/dL   Very High Performed at [facility], [address], [place], [place] [address]   Iron and TIBC     Status: Abnormal   Collection Time: 04/14/24  8:13 AM  Result Value Ref Range   Iron 18 (L) 28 - 170 ug/dL   TIBC 469 629 - 528 ug/dL   Saturation Ratios 5 (L) 10.4 - 31.8 %   UIBC 377 ug/dL    Comment: Performed at [facility], [address], [place], [place] [address]  Ferritin     Status: Abnormal   Collection Time: 04/14/24  8:13 AM  Result Value Ref Range   Ferritin 4 (L) 11 - 307 ng/mL    Comment: Performed at [facility], [address] [place], [place] [address]  Reticulocytes     Status: Abnormal   Collection Time: 04/14/24   8:13 AM  Result Value Ref Range   Retic Ct Pct 1.7 0.4 - 3.1 %   RBC. 4.90 3.87 - 5.11 MIL/uL   Retic Count, Absolute 81.3 19.0 - 186.0 K/uL   Immature Retic Fract 20.8 (H) 2.3 - 15.9 %    Comment: Performed at [facility], [address] [place], [place] [address]   Personally reviewed CT and showed patient- multiple large stones and distended gallbladder with some inflammation of the wall   CT ABDOMEN PELVIS W CONTRAST Result Date: 04/14/2024 CLINICAL DATA:  Right abdominal pain EXAM: CT ABDOMEN AND PELVIS WITH CONTRAST TECHNIQUE: Multidetector CT imaging of the abdomen and pelvis was performed using the standard protocol  following bolus administration of intravenous contrast. RADIATION DOSE REDUCTION: This exam was performed according to the departmental dose-optimization program which includes automated exposure control, adjustment of the mA and/or kV according to patient size and/or use of iterative reconstruction technique. CONTRAST:  100mL OMNIPAQUE IOHEXOL 300 MG/ML  SOLN COMPARISON:  None Available. FINDINGS: Lower chest: Lung bases are clear. Hepatobiliary: Liver is within normal limits. Cholelithiasis, including a gallstone in the gallbladder neck (image 26), with mild gallbladder wall thickening/edema (image 38). No convincing pericholecystic inflammatory changes. No intrahepatic or extrahepatic ductal dilatation. Pancreas: Within normal limits. Spleen: Within normal limits Adrenals/Urinary Tract: Adrenal glands are within normal limits. Kidneys with normal is.  No hydronephrosis. Bladder is within normal limits. Stomach/Bowel: Stomach is notable for a small hiatal hernia. No evidence of bowel obstruction. Normal appendix (image 71). No colonic wall thickening or inflammatory changes. Vascular/Lymphatic: No evidence of abdominal aortic aneurysm. No suspicious abdominopelvic lymphadenopathy. Reproductive: Uterus and bilateral ovaries are within normal limits. Other: Small volume pelvic  ascites. Musculoskeletal: Visualized osseous structures are within normal limits. IMPRESSION: Cholelithiasis with mild gallbladder wall thickening/edema. No convincing pericholecystic inflammatory changes. However, this appearance raises concern for early acute cholecystitis. Consider right upper quadrant ultrasound for further evaluation as clinically warranted. Electronically Signed   By: Zadie Herter M.D.   On: 04/14/2024 03:53     Assessment & Plan:  Andrea Parrish is a 38 y.o. female with acute cholecystitis on imaging CT. She is continuing to have pain. US  to ensure no other pathology but given size of stones I do not think she is likely to pass any stones in CBD.   PLAN: I counseled the patient about the indication, risks and benefits of robotic assisted laparoscopic cholecystectomy.  She understands there is a very small chance for bleeding, infection, injury to normal structures (including common bile duct), conversion to open surgery, persistent symptoms, evolution of postcholecystectomy diarrhea, need for secondary interventions, anesthesia reaction, cardiopulmonary issues and other risks not specifically detailed here. I described the expected recovery, the plan for follow-up and the restrictions during the recovery phase.  All questions were answered.  US  NPO midnight Labs in AM Surgery tomorrow   All questions were answered to the satisfaction of the patient.  Awilda Bogus 04/14/2024, 11:31 AM

## 2024-04-14 NOTE — H&P (View-Only) (Signed)
 [facility] Consult  Reason for Consult: Acute cholecystitis  Referring Physician:  Dr. Lincoln Renshaw   Chief Complaint   Abdominal Pain     HPI: Andrea Parrish is a 38 y.o. female with iron deficiency anemia, anxiety, depression, HTN, hyperlipidemia, reactive airway and menorrhagia, ventricular bigmeniny. She came in with RUQ pain that started at 10pm.  She did have an episode of vomiting up acid but no food. She says the pain is constant and continues.  She has had a C section in the past but no other abdominal surgeries.   Past Medical History:  Diagnosis Date   Irregular heart beat     Past Surgical History:  Procedure Laterality Date   CESAREAN SECTION      History reviewed. No pertinent family history.  Social History   Tobacco Use   Smoking status: Never  Substance Use Topics   Alcohol use: Not Currently   Drug use: Not Currently    Medications: I have reviewed the patient's current medications. Prior to Admission:  Medications Prior to Admission  Medication Sig Dispense Refill Last Dose/Taking   albuterol (VENTOLIN HFA) 108 (90 Base) MCG/ACT inhaler INHALE 1 PUFF BY MOUTH EVERY 4 HOURS AS NEEDED   Taking   buPROPion (WELLBUTRIN XL) 150 MG 24 hr tablet Take 1 tablet by mouth every morning.   Taking   diltiazem (CARDIZEM CD) 120 MG 24 hr capsule Take 120 mg by mouth daily.   Taking   diltiazem (DILT-XR) 120 MG 24 hr capsule TAKE 1 CAPSULE BY MOUTH ONCE DAILY AS DIRECTED FOR 90 DAYS   Taking   escitalopram (LEXAPRO) 20 MG tablet Take 1 tablet by mouth daily.   Taking   fluticasone (FLONASE) 50 MCG/ACT nasal spray 1-2 sprays in each nostril daily   Taking   cetirizine (ZYRTEC) 10 MG tablet Take 10 mg by mouth.      Ferrous Sulfate (IRON Parrish) Take 1 tablet by mouth.      omeprazole (PRILOSEC OTC) 20 MG tablet Take 1 tablet by mouth daily.      potassium chloride  SA (K-DUR,KLOR-CON ) 20 MEQ tablet Take 1 tablet (20 mEq total) by mouth daily. 4 tablet 0     Prenatal Vit-DSS-Fe Fum-FA (PRENATAL 19) tablet Take 1 tablet by mouth daily.      Scheduled:  Chlorhexidine Gluconate Cloth  6 each Topical Once   And   Chlorhexidine Gluconate Cloth  6 each Topical Once   [START ON 04/15/2024] heparin  5,000 Units Subcutaneous Q8H   [START ON 04/15/2024] indocyanine green  2.5 mg Intravenous Once   Continuous:  sodium chloride 125 mL/hr at 04/14/24 0559   [START ON 04/15/2024] cefoTEtan (CEFOTAN) IV     [START ON 04/15/2024] cefTRIAXone (ROCEPHIN)  IV     metronidazole     YQI:[id number] **OR** acetaminophen, albuterol, bisacodyl, fentaNYL (SUBLIMAZE) injection, ondansetron **OR** ondansetron (ZOFRAN) IV, oxyCODONE, traZODone  Allergies  Allergen Reactions   Aspirin Hives    Most over the counter, pill form pain killers.     ROS:  A comprehensive review of systems was negative except for: Gastrointestinal: positive for abdominal pain  Blood pressure 120/65, pulse (!) 50, temperature 98.1 F (36.7 C), resp. rate 18, height 5\' 6"  (1.676 m), weight 112 kg, last menstrual period 03/31/2024, SpO2 100%. Physical Exam Vitals reviewed.  HENT:     Head: Normocephalic.  Eyes:     Extraocular Movements: Extraocular movements intact.  Cardiovascular:     Rate and Rhythm: Normal rate.  Pulmonary:     Breath sounds: Normal breath sounds.  Abdominal:     General: There is no distension.     Palpations: Abdomen is soft.     Tenderness: There is abdominal tenderness in the right upper quadrant and epigastric area.  Musculoskeletal:     Comments: Moves all extremities   Skin:    General: Skin is warm.  Neurological:     General: No focal deficit present.     Mental Status: She is alert and oriented to person, place, and time.  Psychiatric:        Mood and Affect: Mood normal.     Results: Results for orders placed or performed during the hospital encounter of 04/14/24 (from the past 48 hours)  Lipase, blood     Status: None   Collection Time:  04/14/24  2:43 AM  Result Value Ref Range   Lipase 30 11 - 51 U/L    Comment: Performed at [facility], [address], [place], [place] [address]  Comprehensive metabolic panel     Status: Abnormal   Collection Time: 04/14/24  2:43 AM  Result Value Ref Range   Sodium 136 135 - 145 mmol/L   Potassium 3.6 3.5 - 5.1 mmol/L   Chloride 103 98 - 111 mmol/L   CO2 26 22 - 32 mmol/L   Glucose, Bld 100 (H) 70 - 99 mg/dL    Comment: Glucose reference range applies only to samples taken after fasting for at least 8 hours.   BUN 16 6 - 20 mg/dL   Creatinine, Ser 6.04 0.44 - 1.00 mg/dL   Calcium 8.7 (L) 8.9 - 10.3 mg/dL   Total Protein 6.9 6.5 - 8.1 g/dL   Albumin 3.3 (L) 3.5 - 5.0 g/dL   AST 17 15 - 41 U/L   ALT 13 0 - 44 U/L   Alkaline Phosphatase 57 38 - 126 U/L   Total Bilirubin 0.4 0.0 - 1.2 mg/dL   GFR, Estimated >54 >09 mL/min    Comment: (NOTE) Calculated using the CKD-EPI Creatinine Equation (2021)    Anion gap 7 5 - 15    Comment: Performed at [facility], [address], [place], [place] [address]  CBC     Status: Abnormal   Collection Time: 04/14/24  2:43 AM  Result Value Ref Range   WBC 9.1 4.0 - 10.5 K/uL   RBC 4.82 3.87 - 5.11 MIL/uL   Hemoglobin 11.6 (L) 12.0 - 15.0 g/dL   HCT 47.8 29.5 - 62.1 %   MCV 79.5 (L) 80.0 - 100.0 fL   MCH 24.1 (L) 26.0 - 34.0 pg   MCHC 30.3 30.0 - 36.0 g/dL   RDW 30.8 65.7 - 84.6 %   Platelets 260 150 - 400 K/uL   nRBC 0.0 0.0 - 0.2 %    Comment: Performed at [facility], [address] [place], [place] [address]  Urinalysis, Routine w reflex microscopic -Urine, Clean Catch     Status: Abnormal   Collection Time: 04/14/24  2:43 AM  Result Value Ref Range   Color, Urine STRAW (A) YELLOW   APPearance CLEAR CLEAR   Specific Gravity, Urine 1.013 1.005 - 1.030   pH 5.0 5.0 - 8.0   Glucose, UA NEGATIVE NEGATIVE mg/dL   Hgb urine dipstick NEGATIVE NEGATIVE   Bilirubin Urine NEGATIVE NEGATIVE   Ketones, ur NEGATIVE NEGATIVE mg/dL   Protein,  ur NEGATIVE NEGATIVE mg/dL   Nitrite NEGATIVE NEGATIVE   Leukocytes,Ua SMALL (A)  NEGATIVE   RBC / HPF 0-5 0 - 5 RBC/hpf   WBC, UA 0-5 0 - 5 WBC/hpf   Bacteria, UA NONE SEEN NONE SEEN   Squamous Epithelial / HPF 0-5 0 - 5 /HPF    Comment: Performed at [facility], [address], [place], [place] [address]  POC urine preg, ED     Status: None   Collection Time: 04/14/24  2:55 AM  Result Value Ref Range   Preg Test, Ur Negative Negative  Lipid panel     Status: Abnormal   Collection Time: 04/14/24  6:00 AM  Result Value Ref Range   Cholesterol 219 (H) 0 - 200 mg/dL   Triglycerides 68 <604 mg/dL   HDL 48 >54 mg/dL   Total CHOL/HDL Ratio 4.6 RATIO   VLDL 14 0 - 40 mg/dL   LDL Cholesterol 098 (H) 0 - 99 mg/dL    Comment:        Total Cholesterol/HDL:CHD Risk Coronary Heart Disease Risk Table                     Men   Women  1/2 Average Risk   3.4   3.3  Average Risk       5.0   4.4  2 X Average Risk   9.6   7.1  3 X Average Risk  23.4   11.0        Use the calculated Patient Ratio above and the CHD Risk Table to determine the patient's CHD Risk.        ATP III CLASSIFICATION (LDL):  <100     mg/dL   Optimal  119-147  mg/dL   Near or Above                    Optimal  130-159  mg/dL   Borderline  829-562  mg/dL   High  >130     mg/dL   Very High Performed at [facility], [address], [place], [place] [address]   Iron and TIBC     Status: Abnormal   Collection Time: 04/14/24  8:13 AM  Result Value Ref Range   Iron 18 (L) 28 - 170 ug/dL   TIBC 469 629 - 528 ug/dL   Saturation Ratios 5 (L) 10.4 - 31.8 %   UIBC 377 ug/dL    Comment: Performed at [facility], [address], [place], [place] [address]  Ferritin     Status: Abnormal   Collection Time: 04/14/24  8:13 AM  Result Value Ref Range   Ferritin 4 (L) 11 - 307 ng/mL    Comment: Performed at [facility], [address] [place], [place] [address]  Reticulocytes     Status: Abnormal   Collection Time: 04/14/24   8:13 AM  Result Value Ref Range   Retic Ct Pct 1.7 0.4 - 3.1 %   RBC. 4.90 3.87 - 5.11 MIL/uL   Retic Count, Absolute 81.3 19.0 - 186.0 K/uL   Immature Retic Fract 20.8 (H) 2.3 - 15.9 %    Comment: Performed at [facility], [address] [place], [place] [address]   Personally reviewed CT and showed patient- multiple large stones and distended gallbladder with some inflammation of the wall   CT ABDOMEN PELVIS W CONTRAST Result Date: 04/14/2024 CLINICAL DATA:  Right abdominal pain EXAM: CT ABDOMEN AND PELVIS WITH CONTRAST TECHNIQUE: Multidetector CT imaging of the abdomen and pelvis was performed using the standard protocol  following bolus administration of intravenous contrast. RADIATION DOSE REDUCTION: This exam was performed according to the departmental dose-optimization program which includes automated exposure control, adjustment of the mA and/or kV according to patient size and/or use of iterative reconstruction technique. CONTRAST:  100mL OMNIPAQUE IOHEXOL 300 MG/ML  SOLN COMPARISON:  None Available. FINDINGS: Lower chest: Lung bases are clear. Hepatobiliary: Liver is within normal limits. Cholelithiasis, including a gallstone in the gallbladder neck (image 26), with mild gallbladder wall thickening/edema (image 38). No convincing pericholecystic inflammatory changes. No intrahepatic or extrahepatic ductal dilatation. Pancreas: Within normal limits. Spleen: Within normal limits Adrenals/Urinary Tract: Adrenal glands are within normal limits. Kidneys with normal is.  No hydronephrosis. Bladder is within normal limits. Stomach/Bowel: Stomach is notable for a small hiatal hernia. No evidence of bowel obstruction. Normal appendix (image 71). No colonic wall thickening or inflammatory changes. Vascular/Lymphatic: No evidence of abdominal aortic aneurysm. No suspicious abdominopelvic lymphadenopathy. Reproductive: Uterus and bilateral ovaries are within normal limits. Other: Small volume pelvic  ascites. Musculoskeletal: Visualized osseous structures are within normal limits. IMPRESSION: Cholelithiasis with mild gallbladder wall thickening/edema. No convincing pericholecystic inflammatory changes. However, this appearance raises concern for early acute cholecystitis. Consider right upper quadrant ultrasound for further evaluation as clinically warranted. Electronically Signed   By: Zadie Herter M.D.   On: 04/14/2024 03:53     Assessment & Plan:  Andrea Parrish is a 38 y.o. female with acute cholecystitis on imaging CT. She is continuing to have pain. US  to ensure no other pathology but given size of stones I do not think she is likely to pass any stones in CBD.   PLAN: I counseled the patient about the indication, risks and benefits of robotic assisted laparoscopic cholecystectomy.  She understands there is a very small chance for bleeding, infection, injury to normal structures (including common bile duct), conversion to open surgery, persistent symptoms, evolution of postcholecystectomy diarrhea, need for secondary interventions, anesthesia reaction, cardiopulmonary issues and other risks not specifically detailed here. I described the expected recovery, the plan for follow-up and the restrictions during the recovery phase.  All questions were answered.  US  NPO midnight Labs in AM Surgery tomorrow   All questions were answered to the satisfaction of the patient.  Awilda Bogus 04/14/2024, 11:31 AM

## 2024-04-14 NOTE — ED Triage Notes (Signed)
 Pt with c/o right sided abdominal pain that started around 2200. Denies any other symptoms.

## 2024-04-14 NOTE — ED Provider Notes (Signed)
 [place] EMERGENCY DEPARTMENT AT [facility] Provider Note   CSN: [id number] Arrival date & time: 04/14/24  4098     History  Chief Complaint  Patient presents with   Abdominal Pain    Andrea Parrish is a 38 y.o. female.  Patient is a 38 year old female presenting with complaints of right upper quadrant pain.  This started approximately 10 PM in the absence of any injury or trauma.  No nausea or vomiting.  No fevers or chills.  Pain has been constant with no aggravating or alleviating factors.       Home Medications Prior to Admission medications   Medication Sig Start Date End Date Taking? Authorizing Provider  potassium chloride  SA (K-DUR,KLOR-CON ) 20 MEQ tablet Take 1 tablet (20 mEq total) by mouth daily. 08/27/13   Pisciotta, Peterson Brandt, PA-C      Allergies    Aspirin    Review of Systems   Review of Systems  All other systems reviewed and are negative.   Physical Exam Updated Vital Signs BP (!) 149/69 (BP Location: Left Arm)   Pulse 63   Temp 97.6 F (36.4 C) (Oral)   Resp 20   Ht 5\' 6"  (1.676 m)   Wt 112 kg   LMP 03/31/2024 (Exact Date)   SpO2 99%   BMI 39.87 kg/m  Physical Exam Vitals and nursing note reviewed.  Constitutional:      General: She is not in acute distress.    Appearance: She is well-developed. She is not diaphoretic.  HENT:     Head: Normocephalic and atraumatic.  Cardiovascular:     Rate and Rhythm: Normal rate and regular rhythm.     Heart sounds: No murmur heard.    No friction rub. No gallop.  Pulmonary:     Effort: Pulmonary effort is normal. No respiratory distress.     Breath sounds: Normal breath sounds. No wheezing.  Abdominal:     General: Bowel sounds are normal. There is no distension.     Palpations: Abdomen is soft.     Tenderness: There is abdominal tenderness in the right upper quadrant. There is no right CVA tenderness, left CVA tenderness, guarding or rebound.  Musculoskeletal:        General: Normal  range of motion.     Cervical back: Normal range of motion and neck supple.  Skin:    General: Skin is warm and dry.  Neurological:     General: No focal deficit present.     Mental Status: She is alert and oriented to person, place, and time.     ED Results / Procedures / Treatments   Labs (all labs ordered are listed, but only abnormal results are displayed) Labs Reviewed  CBC - Abnormal; Notable for the following components:      Result Value   Hemoglobin 11.6 (*)    MCV 79.5 (*)    MCH 24.1 (*)    All other components within normal limits  URINALYSIS, ROUTINE W REFLEX MICROSCOPIC - Abnormal; Notable for the following components:   Color, Urine STRAW (*)    Leukocytes,Ua SMALL (*)    All other components within normal limits  LIPASE, BLOOD  COMPREHENSIVE METABOLIC PANEL WITH GFR  POC URINE PREG, ED    EKG EKG Interpretation Date/Time:  Monday April 14 2024 02:42:51 EDT Ventricular Rate:  62 PR Interval:  163 QRS Duration:  115 QT Interval:  440 QTC Calculation: 447 R Axis:   32  Text Interpretation: Sinus  rhythm Normal ECG Confirmed by Orvilla Blander ([id number]) on 04/14/2024 2:58:53 AM  Radiology No results found.  Procedures Procedures    Medications Ordered in ED Medications - No data to display  ED Course/ Medical Decision Making/ A&P  Patient presenting here with right upper quadrant pain as described in the HPI.  Patient arrives with stable vital signs and is afebrile.  Physical examination does reveal tenderness in the right upper quadrant, but no peritoneal signs.  Laboratory studies obtained including CBC, CMP, and lipase, all of which are basically unremarkable.  There is no leukocytosis, no elevation of liver or pancreatic enzymes, and no significant electrolyte derangement.  CT scan of the abdomen and pelvis obtained showing multiple gallstones along with a thickened gallbladder wall, consistent with possible early cholecystitis.  Patient has  declined pain medication thus far.  I did discuss care with Dr. Larrie Po from general surgery.  Patient to be admitted to the hospitalist service for surgical consultation and possible cholecystectomy.  I have spoken with Dr. Amy Kansky who agrees to admit.  Patient will receive Rocephin and Flagyl.  She will also be kept n.p.o. and maintenance fluids initiated.  Final Clinical Impression(s) / ED Diagnoses Final diagnoses:  None    Rx / DC Orders ED Discharge Orders     None         Orvilla Blander, MD 04/14/24 [phone]

## 2024-04-14 NOTE — ED Notes (Signed)
 Patient transported to CT

## 2024-04-14 NOTE — Progress Notes (Signed)
   04/14/24 1606  TOC Brief Assessment  Insurance and Status Reviewed  Patient has primary care physician Yes  Home environment has been reviewed From home  Prior level of function: Independent  Prior/Current Home Services No current home services  Social Drivers of Health Review SDOH reviewed no interventions necessary  Readmission risk has been reviewed Yes  Transition of care needs no transition of care needs at this time   Transition of Care Department [facility]) has reviewed patient and no other TOC needs have been identified at this time. We will continue to monitor patient advancement through interdisciplinary progression rounds. If new patient needs arise, please place a TOC consult.

## 2024-04-14 NOTE — Hospital Course (Addendum)
 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  She denied nausea and vomiting symptoms.  Patient denied fever and chills.  She reported that the pain has been constant.  Her labs were unremarkable.  She was sent for CT of the abdomen and pelvis with findings of multiple gallstones and a thickened gallbladder wall consistent with early cholecystitis.  General surgery was consulted who recommended patient be kept n.p.o. started on antibiotics and they will consult.

## 2024-04-15 ENCOUNTER — Encounter (HOSPITAL_COMMUNITY)
Admission: EM | Disposition: A | Discharge status: Home / Self Care | Payer: Self-pay | Source: Home / Self Care | Attending: Family Medicine

## 2024-04-15 ENCOUNTER — Observation Stay (HOSPITAL_COMMUNITY): Admitting: Certified Registered"

## 2024-04-15 ENCOUNTER — Other Ambulatory Visit: Payer: Self-pay

## 2024-04-15 ENCOUNTER — Observation Stay (HOSPITAL_BASED_OUTPATIENT_CLINIC_OR_DEPARTMENT_OTHER): Admitting: Certified Registered"

## 2024-04-15 ENCOUNTER — Encounter (HOSPITAL_COMMUNITY): Payer: Self-pay | Admitting: Internal Medicine

## 2024-04-15 DIAGNOSIS — K810 Acute cholecystitis: Secondary | ICD-10-CM | POA: Diagnosis not present

## 2024-04-15 DIAGNOSIS — N920 Excessive and frequent menstruation with regular cycle: Secondary | ICD-10-CM | POA: Diagnosis not present

## 2024-04-15 DIAGNOSIS — D500 Iron deficiency anemia secondary to blood loss (chronic): Secondary | ICD-10-CM | POA: Diagnosis not present

## 2024-04-15 LAB — CBC
HCT: 37.9 % (ref 36.0–46.0)
Hemoglobin: 11.1 g/dL — ABNORMAL LOW (ref 12.0–15.0)
MCH: 23.6 pg — ABNORMAL LOW (ref 26.0–34.0)
MCHC: 29.3 g/dL — ABNORMAL LOW (ref 30.0–36.0)
MCV: 80.6 fL (ref 80.0–100.0)
Platelets: 231 10*3/uL (ref 150–400)
RBC: 4.7 MIL/uL (ref 3.87–5.11)
RDW: 14.6 % (ref 11.5–15.5)
WBC: 7.4 10*3/uL (ref 4.0–10.5)
nRBC: 0 % (ref 0.0–0.2)

## 2024-04-15 LAB — COMPREHENSIVE METABOLIC PANEL WITH GFR
ALT: 12 U/L (ref 0–44)
AST: 13 U/L — ABNORMAL LOW (ref 15–41)
Albumin: 2.8 g/dL — ABNORMAL LOW (ref 3.5–5.0)
Alkaline Phosphatase: 50 U/L (ref 38–126)
Anion gap: 6 (ref 5–15)
BUN: 8 mg/dL (ref 6–20)
CO2: 26 mmol/L (ref 22–32)
Calcium: 8.3 mg/dL — ABNORMAL LOW (ref 8.9–10.3)
Chloride: 106 mmol/L (ref 98–111)
Creatinine, Ser: 0.82 mg/dL (ref 0.44–1.00)
GFR, Estimated: >60 mL/min (ref >60)
Glucose, Bld: 83 mg/dL (ref 70–99)
Potassium: 3.7 mmol/L (ref 3.5–5.1)
Sodium: 138 mmol/L (ref 135–145)
Total Bilirubin: 0.6 mg/dL (ref 0.0–1.2)
Total Protein: 6 g/dL — ABNORMAL LOW (ref 6.5–8.1)

## 2024-04-15 LAB — SURGICAL PCR SCREEN
MRSA, PCR: NEGATIVE
Staphylococcus aureus: NEGATIVE

## 2024-04-15 LAB — MAGNESIUM: Magnesium: 1.9 mg/dL (ref 1.7–2.4)

## 2024-04-15 SURGERY — CHOLECYSTECTOMY, ROBOT-ASSISTED, LAPAROSCOPIC
Anesthesia: General | Site: Abdomen

## 2024-04-15 MED ORDER — STERILE WATER FOR IRRIGATION IR SOLN
Status: DC | PRN
  Administered 2024-04-15: 500 mL

## 2024-04-15 MED ORDER — INDOCYANINE GREEN 25 MG IV SOLR
INTRAVENOUS | Status: AC
Start: 2024-04-15 — End: 2024-04-15
  Filled 2024-04-15: qty 10

## 2024-04-15 MED ORDER — KETOROLAC TROMETHAMINE 30 MG/ML IJ SOLN
INTRAMUSCULAR | Status: AC
  Filled 2024-04-15: qty 1

## 2024-04-15 MED ORDER — ONDANSETRON HCL 4 MG/2ML IJ SOLN
INTRAMUSCULAR | Status: AC
  Filled 2024-04-15: qty 2

## 2024-04-15 MED ORDER — PROPOFOL 10 MG/ML IV BOLUS
INTRAVENOUS | Status: AC
  Filled 2024-04-15: qty 20

## 2024-04-15 MED ORDER — ONDANSETRON HCL 4 MG/2ML IJ SOLN
INTRAMUSCULAR | Status: DC | PRN
  Administered 2024-04-15: 4 mg via INTRAVENOUS

## 2024-04-15 MED ORDER — SCOPOLAMINE 1 MG/3DAYS TD PT72
MEDICATED_PATCH | TRANSDERMAL | Status: AC
  Administered 2024-04-15: 1.5 mg
  Filled 2024-04-15: qty 1

## 2024-04-15 MED ORDER — LACTATED RINGERS IV SOLN: INTRAVENOUS | Status: DC

## 2024-04-15 MED ORDER — FENTANYL CITRATE (PF) 250 MCG/5ML IJ SOLN
INTRAMUSCULAR | Status: AC
  Filled 2024-04-15: qty 5

## 2024-04-15 MED ORDER — SODIUM CHLORIDE 0.9 % IV SOLN: 12.5000 mg | INTRAVENOUS | Status: DC | PRN

## 2024-04-15 MED ORDER — FENTANYL CITRATE (PF) 250 MCG/5ML IJ SOLN
INTRAMUSCULAR | Status: DC | PRN
  Administered 2024-04-15 (×3): 50 ug via INTRAVENOUS

## 2024-04-15 MED ORDER — EPHEDRINE SULFATE-NACL 50-0.9 MG/10ML-% IV SOSY
PREFILLED_SYRINGE | INTRAVENOUS | Status: DC | PRN
Start: 2024-04-15 — End: 2024-04-15
  Administered 2024-04-15: 5 mg via INTRAVENOUS

## 2024-04-15 MED ORDER — LIDOCAINE 2% (20 MG/ML) 5 ML SYRINGE
INTRAMUSCULAR | Status: DC | PRN
  Administered 2024-04-15: 100 mg via INTRAVENOUS

## 2024-04-15 MED ORDER — LIDOCAINE 2% (20 MG/ML) 5 ML SYRINGE
INTRAMUSCULAR | Status: AC
  Filled 2024-04-15: qty 5

## 2024-04-15 MED ORDER — DEXMEDETOMIDINE HCL IN NACL 80 MCG/20ML IV SOLN
INTRAVENOUS | Status: DC | PRN
  Administered 2024-04-15: 8 ug via INTRAVENOUS
  Administered 2024-04-15: 4 ug via INTRAVENOUS

## 2024-04-15 MED ORDER — PHENYLEPHRINE 80 MCG/ML (10ML) SYRINGE FOR IV PUSH (FOR BLOOD PRESSURE SUPPORT)
PREFILLED_SYRINGE | INTRAVENOUS | Status: DC | PRN
Start: 2024-04-15 — End: 2024-04-15
  Administered 2024-04-15 (×2): 80 ug via INTRAVENOUS

## 2024-04-15 MED ORDER — SCOPOLAMINE 1 MG/3DAYS TD PT72: 1.0000 | MEDICATED_PATCH | Freq: Once | TRANSDERMAL | Status: DC

## 2024-04-15 MED ORDER — DEXAMETHASONE SODIUM PHOSPHATE 10 MG/ML IJ SOLN
INTRAMUSCULAR | Status: AC
  Filled 2024-04-15: qty 1

## 2024-04-15 MED ORDER — ONDANSETRON HCL 4 MG/2ML IJ SOLN
4.0000 mg | Freq: Once | INTRAMUSCULAR | Status: AC
  Administered 2024-04-15: 4 mg via INTRAVENOUS

## 2024-04-15 MED ORDER — MIDAZOLAM HCL 2 MG/2ML IJ SOLN
INTRAMUSCULAR | Status: DC | PRN
  Administered 2024-04-15: 2 mg via INTRAVENOUS

## 2024-04-15 MED ORDER — ORAL CARE MOUTH RINSE: 15.0000 mL | Freq: Once | OROMUCOSAL | Status: AC

## 2024-04-15 MED ORDER — PROPOFOL 10 MG/ML IV BOLUS
INTRAVENOUS | Status: DC | PRN
  Administered 2024-04-15: 20 ug/kg/min via INTRAVENOUS
  Administered 2024-04-15: 200 mg via INTRAVENOUS

## 2024-04-15 MED ORDER — OXYCODONE HCL 5 MG/5ML PO SOLN: 5.0000 mg | Freq: Once | ORAL | Status: DC | PRN

## 2024-04-15 MED ORDER — EPHEDRINE 5 MG/ML INJ
INTRAVENOUS | Status: AC
  Filled 2024-04-15: qty 5

## 2024-04-15 MED ORDER — OXYCODONE HCL 5 MG PO TABS: 5.0000 mg | ORAL_TABLET | Freq: Once | ORAL | Status: DC | PRN

## 2024-04-15 MED ORDER — DEXMEDETOMIDINE HCL IN NACL 80 MCG/20ML IV SOLN
INTRAVENOUS | Status: AC
  Filled 2024-04-15: qty 20

## 2024-04-15 MED ORDER — SUGAMMADEX SODIUM 200 MG/2ML IV SOLN
INTRAVENOUS | Status: DC | PRN
  Administered 2024-04-15 (×3): 100 mg via INTRAVENOUS

## 2024-04-15 MED ORDER — MIDAZOLAM HCL 2 MG/2ML IJ SOLN
INTRAMUSCULAR | Status: AC
  Filled 2024-04-15: qty 2

## 2024-04-15 MED ORDER — BUPIVACAINE HCL (PF) 0.5 % IJ SOLN
INTRAMUSCULAR | Status: AC
  Filled 2024-04-15: qty 30

## 2024-04-15 MED ORDER — CHLORHEXIDINE GLUCONATE 0.12 % MT SOLN
15.0000 mL | Freq: Once | OROMUCOSAL | Status: AC
  Administered 2024-04-15: 15 mL via OROMUCOSAL

## 2024-04-15 MED ORDER — ROCURONIUM BROMIDE 10 MG/ML (PF) SYRINGE
PREFILLED_SYRINGE | INTRAVENOUS | Status: DC | PRN
  Administered 2024-04-15: 60 mg via INTRAVENOUS
  Administered 2024-04-15: 10 mg via INTRAVENOUS

## 2024-04-15 MED ORDER — METRONIDAZOLE 500 MG/100ML IV SOLN
500.0000 mg | Freq: Two times a day (BID) | INTRAVENOUS | Status: DC
  Administered 2024-04-15: 500 mg via INTRAVENOUS
  Filled 2024-04-15: qty 100

## 2024-04-15 MED ORDER — BUPIVACAINE HCL (PF) 0.5 % IJ SOLN
INTRAMUSCULAR | Status: DC | PRN
  Administered 2024-04-15: 30 mL

## 2024-04-15 MED ORDER — GLYCOPYRROLATE PF 0.2 MG/ML IJ SOSY
PREFILLED_SYRINGE | INTRAMUSCULAR | Status: DC | PRN
  Administered 2024-04-15: .1 mg via INTRAVENOUS

## 2024-04-15 MED ORDER — DEXAMETHASONE SODIUM PHOSPHATE 10 MG/ML IJ SOLN
INTRAMUSCULAR | Status: DC | PRN
Start: 2024-04-15 — End: 2024-04-15
  Administered 2024-04-15: 10 mg via INTRAVENOUS

## 2024-04-15 MED ORDER — HYDROMORPHONE HCL 1 MG/ML IJ SOLN
0.2500 mg | INTRAMUSCULAR | Status: DC | PRN
  Administered 2024-04-15 (×3): 0.5 mg via INTRAVENOUS
  Filled 2024-04-15 (×3): qty 0.5

## 2024-04-15 MED ORDER — ROCURONIUM BROMIDE 10 MG/ML (PF) SYRINGE
PREFILLED_SYRINGE | INTRAVENOUS | Status: AC
  Filled 2024-04-15: qty 10

## 2024-04-15 MED ORDER — GLYCOPYRROLATE PF 0.2 MG/ML IJ SOSY
PREFILLED_SYRINGE | INTRAMUSCULAR | Status: AC
  Filled 2024-04-15: qty 1

## 2024-04-15 SURGICAL SUPPLY — 37 items
BLADE SURG 15 STRL LF DISP TIS (BLADE) ×1 IMPLANT
CAUTERY HOOK MNPLR 1.6 DVNC XI (INSTRUMENTS) ×1 IMPLANT
CHLORAPREP W/TINT 26 (MISCELLANEOUS) ×1 IMPLANT
CLIP LIGATING HEM O LOK PURPLE (MISCELLANEOUS) ×1 IMPLANT
COVER LIGHT HANDLE STERIS (MISCELLANEOUS) ×2 IMPLANT
DERMABOND ADVANCED .7 DNX12 (GAUZE/BANDAGES/DRESSINGS) ×1 IMPLANT
DRAPE ARM DVNC X/XI (DISPOSABLE) ×4 IMPLANT
DRAPE COLUMN DVNC XI (DISPOSABLE) ×1 IMPLANT
ELECTRODE REM PT RTRN 9FT ADLT (ELECTROSURGICAL) ×1 IMPLANT
FORCEPS BPLR R/ABLATION 8 DVNC (INSTRUMENTS) ×1 IMPLANT
FORCEPS PROGRASP DVNC XI (FORCEP) ×1 IMPLANT
GLOVE BIO SURGEON STRL SZ 6.5 (GLOVE) ×2 IMPLANT
GLOVE BIOGEL PI IND STRL 6.5 (GLOVE) ×2 IMPLANT
GLOVE BIOGEL PI IND STRL 7.0 (GLOVE) ×2 IMPLANT
GOWN STRL REUS W/TWL LRG LVL3 (GOWN DISPOSABLE) ×3 IMPLANT
GRASPER SUT TROCAR 14GX15 (MISCELLANEOUS) ×1 IMPLANT
IRRIGATOR SUCT 8 DISP DVNC XI (IRRIGATION / IRRIGATOR) IMPLANT
KIT TURNOVER KIT A (KITS) ×1 IMPLANT
MANIFOLD NEPTUNE II (INSTRUMENTS) IMPLANT
NDL HYPO 21X1.5 SAFETY (NEEDLE) ×1 IMPLANT
NDL INSUFFLATION 14GA 120MM (NEEDLE) ×1 IMPLANT
NEEDLE HYPO 21X1.5 SAFETY (NEEDLE) ×1 IMPLANT
NEEDLE INSUFFLATION 14GA 120MM (NEEDLE) ×1 IMPLANT
OBTURATOR OPTICALSTD 8 DVNC (TROCAR) ×1 IMPLANT
PACK LAP CHOLE LZT030E (CUSTOM PROCEDURE TRAY) ×1 IMPLANT
PAD ARMBOARD POSITIONER FOAM (MISCELLANEOUS) ×1 IMPLANT
PENCIL HANDSWITCHING (ELECTRODE) ×1 IMPLANT
POSITIONER HEAD 8X9X4 ADT (SOFTGOODS) ×1 IMPLANT
SEAL UNIV 5-12 XI (MISCELLANEOUS) ×4 IMPLANT
SET BASIN LINEN APH (SET/KITS/TRAYS/PACK) ×1 IMPLANT
SET TUBE SMOKE EVAC HIGH FLOW (TUBING) ×1 IMPLANT
SOL .9 NS 3000ML IRR UROMATIC (IV SOLUTION) ×1 IMPLANT
SUT MNCRL AB 4-0 PS2 18 (SUTURE) ×2 IMPLANT
SUT VICRYL 0 UR6 27IN ABS (SUTURE) ×1 IMPLANT
SYR 30ML LL (SYRINGE) ×1 IMPLANT
SYSTEM RETRIEVL 5MM INZII UNIV (BASKET) ×1 IMPLANT
WATER STERILE IRR 500ML POUR (IV SOLUTION) ×1 IMPLANT

## 2024-04-15 NOTE — Anesthesia Procedure Notes (Addendum)
 Procedure Name: Intubation Date/Time: 04/15/2024 12:08 PM  Performed by: Delphine Fiedler, RNPre-anesthesia Checklist: Patient identified, Emergency Drugs available, Suction available and Patient being monitored Patient Re-evaluated:Patient Re-evaluated prior to induction Oxygen Delivery Method: Circle system utilized Preoxygenation: Pre-oxygenation with 100% oxygen Induction Type: IV induction Ventilation: Mask ventilation without difficulty Laryngoscope Size: Mac and 3 Grade View: Grade I Tube type: Oral Tube size: 7.0 mm Number of attempts: 1 Airway Equipment and Method: Stylet and Oral airway Placement Confirmation: ETT inserted through vocal cords under direct vision, positive ETCO2 and breath sounds checked- equal and bilateral Secured at: 22 cm Tube secured with: Tape Dental Injury: Teeth and Oropharynx as per pre-operative assessment  Comments: DL per Chick Cotton

## 2024-04-15 NOTE — Op Note (Signed)
 [facility] Operative Note  04/15/24  Preoperative Diagnosis: Acute cholecystitis    Postoperative Diagnosis: Same   Procedure(s) Performed: Robotic Assisted Laparoscopic Cholecystectomy   Surgeon: Dixon Fredrickson. Collene Dawson, MD   Assistants: No qualified resident was available    Anesthesia: General endotracheal   Anesthesiologist: Araceli Knight, MD    Specimens: Gallbladder   Estimated Blood Loss: Minimal   Blood Replacement: None    Complications: None   Wound Class: Contaminated    Operative Indications: The patient was found to have stone with some concern for acute cholecystitis on imaging and was symptomatic.  We discussed the risk of the procedure including but not limited to bleeding, infection, injury to the common bile duct, bile leak, need for further procedures, chance of subtotal cholecystectomy.   Findings:  Edematous gallbladder consistent with cholecystitis.  Critical view of safety noted All clips intact at the end of the case Adequate hemostasis   Procedure: Firefly was given in the preoperative area. The patient was taken to the operating room and placed supine. General endotracheal anesthesia was induced. Intravenous antibiotics were  administered per protocol.  An orogastric tube positioned to decompress the stomach. The abdomen was prepared and draped in the usual sterile fashion.   Veress needle was placed at the supraumbilical area and insufflation was started after confirming a positive saline drop test and no immediate increase in abdominal pressure.  After reaching 15 mm, the Veress needle was removed and a 8 mm port was placed via optiview technique supraumbilical, measuring 20 mm away from the suspected position of the gallbladder.  The abdomen was inspected and no abnormalities or injuries were found.  Under direct vision, ports were placed in the following locations in a semi curvilinear position around the target of the gallbladder:  Two 8 mm ports on the patient's right each having 8cm clearance to the adjacent ports and one 8 mm port placed on the patient's left 8 cm from the umbilical port. Once ports were placed, the table was placed in the reverse Trendelenburg position with the right side up. The robot platform was brought into the operative field and docked to the ports successfully.  An endoscope was placed through the umbilical port, prograsper through the most lateral right port, forced bipolar to the port just right of the umbilicus, and then a hook cautery in the left port.   The dome of the gallbladder was grasped with prograsp and retracted over the dome of the liver. Adhesions between the gallbladder and omentum, duodenum and transverse colon were lysed via hook cautery. The infundibulum was grasped with the forced bipolar and retracted toward the right lower quadrant. This maneuver exposed Calot's triangle. Firefly was used throughout the dissection to ensure safe visualization of the cystic duct.The peritoneum overlying the gallbladder infundibulum was then dissected and the cystic duct and cystic artery identified.  Critical view of safety with the liver bed clearly visible behind the duct and artery with no additional structures noted.  The cystic duct and cystic artery were doubly clipped and divided close to the gallbladder.    The gallbladder was then dissected from its peritoneal and liver bed attachments by electrocautery. Hemostasis was checked prior to removing the hook cautery.  A 5mm Endo Catch bag was then placed through the left side port. The robot  was undocked and moved out of the field,  and the gallbladder was removed in the bag.  The gallbladder was passed off the table as a specimen. There  was no evidence of bleeding from the gallbladder fossa or cystic artery or leakage of the bile from the cystic duct stump. The left port site closed with a 0 vicryl and PMI due to dilation from removing the  gallbladder.The abdomen was desufflated and secondary trocars were removed under direct vision.   No bleeding was noted. Marcaine was injected.  All skin incisions were closed with subcuticular sutures of 4-0 monocryl and dermabond.    Final inspection revealed acceptable hemostasis. All counts were correct at the end of the case. The patient was awakened from anesthesia and extubated without complication. The OG tube was removed.  The patient went to the PACU in stable condition.   Deena Farrier, MD [facility] [address] [place] [place], [place] [address] [phone] (office)

## 2024-04-15 NOTE — Anesthesia Postprocedure Evaluation (Signed)
 Anesthesia Post Note  Patient: Andrea Parrish  Procedure(s) Performed: CHOLECYSTECTOMY, ROBOT-ASSISTED, LAPAROSCOPIC (Abdomen)  Patient location during evaluation: PACU Anesthesia Type: General Level of consciousness: awake and alert Pain management: pain level controlled Vital Signs Assessment: post-procedure vital signs reviewed and stable Respiratory status: spontaneous breathing, nonlabored ventilation, respiratory function stable and patient connected to nasal cannula oxygen Cardiovascular status: blood pressure returned to baseline and stable Postop Assessment: no apparent nausea or vomiting Anesthetic complications: no   There were no known notable events for this encounter.   Last Vitals:  Vitals:   04/15/24 1400 04/15/24 1402  BP: (!) 96/43   Pulse: 65 (!) 53  Resp: 13 12  Temp:    SpO2: 96% 97%    Last Pain:  Vitals:   04/15/24 1402  TempSrc:   PainSc: 8                  Linell Shawn L Charlyn Vialpando

## 2024-04-15 NOTE — Anesthesia Preprocedure Evaluation (Signed)
 Anesthesia Evaluation  Patient identified by MRN, date of birth, ID band Patient awake    Reviewed: Allergy & Precautions, H&P , NPO status , Patient's Chart, lab work & pertinent test results, reviewed documented beta blocker date and time   Airway Mallampati: II  TM Distance: >3 FB Neck ROM: full    Dental no notable dental hx. (+) Dental Advisory Given, Teeth Intact   Pulmonary neg pulmonary ROS   Pulmonary exam normal breath sounds clear to auscultation       Cardiovascular Exercise Tolerance: Good Normal cardiovascular exam Rhythm:regular Rate:Normal  Irregular heart beat   Neuro/Psych  PSYCHIATRIC DISORDERS Anxiety Depression    negative neurological ROS     GI/Hepatic negative GI ROS, Neg liver ROS,,,  Endo/Other  prediabetes  Renal/GU negative Renal ROS  negative genitourinary   Musculoskeletal   Abdominal   Peds  Hematology negative hematology ROS (+)   Anesthesia Other Findings   Reproductive/Obstetrics negative OB ROS                             Anesthesia Physical Anesthesia Plan  ASA: 2  Anesthesia Plan: General   Post-op Pain Management: Dilaudid IV   Induction: Intravenous  PONV Risk Score and Plan: Ondansetron, Dexamethasone, Midazolam and Scopolamine patch - Pre-op  Airway Management Planned: Oral ETT  Additional Equipment: None  Intra-op Plan:   Post-operative Plan: Extubation in OR  Informed Consent: I have reviewed the patients History and Physical, chart, labs and discussed the procedure including the risks, benefits and alternatives for the proposed anesthesia with the patient or authorized representative who has indicated his/her understanding and acceptance.     Dental Advisory Given  Plan Discussed with: CRNA  Anesthesia Plan Comments:        Anesthesia Quick Evaluation

## 2024-04-15 NOTE — Plan of Care (Signed)
   Problem: Activity: Goal: Risk for activity intolerance will decrease Outcome: Progressing   Problem: Coping: Goal: Level of anxiety will decrease Outcome: Progressing

## 2024-04-15 NOTE — Progress Notes (Signed)
 [facility]  Cholecystectomy done. Attempted Jose but could not get the phone number to connect. Updated team. Hopefully home tomorrow.  Will not need antibiotics at dc Diet today PRN for pain  Deena Farrier, MD [facility] [address] [place] [place], [place] [address] [phone] (office)

## 2024-04-15 NOTE — Interval H&P Note (Signed)
 History and Physical Interval Note:  04/15/2024 11:40 AM  Andrea Parrish  has presented today for surgery, with the diagnosis of acute cholecystitis.  The various methods of treatment have been discussed with the patient and family. After consideration of risks, benefits and other options for treatment, the patient has consented to  Procedure(s): CHOLECYSTECTOMY, ROBOT-ASSISTED, LAPAROSCOPIC (N/A) as a surgical intervention.  The patient's history has been reviewed, patient examined, no change in status, stable for surgery.  I have reviewed the patient's chart and labs.  Questions were answered to the patient's satisfaction.      Awilda Bogus

## 2024-04-15 NOTE — Discharge Instructions (Signed)
 Discharge Robotic Assisted Laparoscopic Surgery Instructions:  Common Complaints: Right shoulder pain is common after laparoscopic surgery. This is secondary to the gas used in the surgery being trapped under the diaphragm.  Walk to help your body absorb the gas. This will improve in a few days. Pain at the port sites are common, especially the larger port sites. This will improve with time.  Some nausea is common and poor appetite. The main goal is to stay hydrated the first few days after surgery.   Diet/ Activity: Diet as tolerated. You may not have an appetite, but it is important to stay hydrated. Drink 64 ounces of water a day. Your appetite will return with time.  Shower per your regular routine daily.  Do not take hot showers. Take warm showers that are less than 10 minutes. Rest and listen to your body, but do not remain in bed all day.  Walk everyday for at least 15-20 minutes. Deep cough and move around every 1-2 hours in the first few days after surgery.  Do not lift > 10 lbs, perform excessive bending, pushing, pulling, squatting for 1-2 weeks after surgery.  Do not pick at the dermabond glue on your incision sites.  This glue film will remain in place for 1-2 weeks and will start to peel off.  Do not place lotions or balms on your incision unless instructed to specifically by Dr. Henreitta Leber.   Pain Expectations and Narcotics: -After surgery you will have pain associated with your incisions and this is normal. The pain is muscular and nerve pain, and will get better with time. -You are encouraged and expected to take non narcotic medications like tylenol and ibuprofen (when able) to treat pain as multiple modalities can aid with pain treatment. -Narcotics are only used when pain is severe or there is breakthrough pain. -You are not expected to have a pain score of 0 after surgery, as we cannot prevent pain. A pain score of 3-4 that allows you to be functional, move, walk, and tolerate  some activity is the goal. The pain will continue to improve over the days after surgery and is dependent on your surgery. -Due to [place] law, we are only able to give a certain amount of pain medication to treat post operative pain, and we only give additional narcotics on a patient by patient basis.  -For most laparoscopic surgery, studies have shown that the majority of patients only need 10-15 narcotic pills, and for open surgeries most patients only need 15-20.   -Having appropriate expectations of pain and knowledge of pain management with non narcotics is important as we do not want anyone to become addicted to narcotic pain medication.  -Using ice packs in the first 48 hours and heating pads after 48 hours, wearing an abdominal binder (when recommended), and using over the counter medications are all ways to help with pain management.   -Simple acts like meditation and mindfulness practices after surgery can also help with pain control and research has proven the benefit of these practices.  Medication: Take tylenol and ibuprofen as needed for pain control, alternating every 4-6 hours.  Example:  Tylenol 1000mg  @ 6am, 12noon, 6pm, (Do not exceed 4000mg  of tylenol a day). Ibuprofen 800mg  @ 9am, 3pm, 9pm, 3am (Do not exceed 3600mg  of ibuprofen a day).  Take Roxicodone for breakthrough pain every 4 hours.  Take Colace for constipation related to narcotic pain medication. If you do not have a bowel movement in 2 days,  take Miralax over the counter.  Drink plenty of water to also prevent constipation.   Contact Information: If you have questions or concerns, please call our office, [phone], Monday- Thursday 8AM-5PM and Friday 8AM-12Noon.  If it is after hours or on the weekend, please call [facility] Main [facility], [phone], [phone], and ask to speak to the surgeon on call for Dr. Henreitta Leber at [facility].

## 2024-04-15 NOTE — Progress Notes (Signed)
 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  She denied nausea and vomiting symptoms.  Patient denied fever and chills.  She reported that the pain has been constant.  Her labs were unremarkable.  She was sent for CT of the abdomen and pelvis with findings of multiple gallstones and a thickened gallbladder wall consistent with early cholecystitis.  General surgery was consulted who recommended patient be kept n.p.o. started on antibiotics and they will consult.   Assessment and Plan:  Acute Cholecystitis - maintain NPO status for surgery today - Dr. Collene Dawson taking patient to OR today   RUQ abdominal pain  - continue IV pain and nausea medications as ordered    History of ventricular bigeminy - Stable takes diltiazem daily - EKG is reassuring with normal sinus rhythm   Iron Deficiency Anemia  - Hg reassuring at 11 today - MCV 79.5   Menorrhagia - Pt noted to have heavy menstrual periods lasting 7 days - She is on iron supplements and followed by PCP   Hyperlipidemia - Reportedly has been diet controlled - Check lipid panel   Reactive airway disease - Patient reportedly uses albuterol inhaler as needed during seasonal allergies - Patient reportedly takes cetirizine daily throughout spring and summer for seasonal allergies   Anxiety and depression -Patient is taking Lexapro recently adjusted by PCP from 10 to 20  mg -Patient also taking Wellbutrin 150 mg XR in addition to Lexapro   Metabolic Syndrome X - reportedly has had elevated blood sugars and A1c in the prediabetic range  DVT prophylaxis:  heparin Code Status: Full  Family Communication:  Disposition: anticipate home when cleared by surgery team   Consultants:  Surgery  Procedures:  4/29: lap chole pending  Antimicrobials:  Ceftriaxone metronidazole   Subjective: Pt waiting for surgery today, no specific complaints  Objective: Vitals:   04/14/24 1715 04/14/24 2138 04/15/24 0327 04/15/24 1018  BP: 108/65 108/62 112/71 118/60  Pulse: (!) 51 (!) 51 (!) 52 (!) 53  Resp: 16 20 19 18   Temp: 98 F (36.7 C) 98.2 F (36.8 C) 98.1 F (36.7 C) 98.5 F (36.9 C)  TempSrc: Oral Oral Oral   SpO2: 100% 97% 96% 100%  Weight:      Height:        Intake/Output Summary (Last 24 hours) at 04/15/2024 1229 Last data filed at 04/14/2024 1600 Gross per 24 hour  Intake 493.21 ml  Output --  Net 493.21 ml   Filed Weights   04/14/24 0239  Weight: 112 kg   Examination:  General exam: Appears calm and comfortable  Respiratory system: Clear to auscultation. Respiratory effort normal. Cardiovascular system: normal S1 & S2 heard. No JVD, murmurs, rubs, gallops or clicks. No pedal edema. Gastrointestinal system: Abdomen is tender RUQ. No organomegaly or masses felt. Normal bowel sounds heard. Central nervous system: Alert and oriented. No focal neurological deficits. Extremities: Symmetric 5 x 5 power. Skin: No rashes, lesions or ulcers. Psychiatry: Judgement and insight appear normal. Mood &  affect appropriate.   Data Reviewed: I have personally reviewed following labs and imaging studies  CBC: Recent Labs  Lab 04/14/24 0243 04/15/24 0427  WBC 9.1 7.4  HGB 11.6* 11.1*  HCT 38.3 37.9  MCV 79.5* 80.6  PLT 260 231    Basic Metabolic Panel: Recent Labs  Lab 04/14/24 0243 04/15/24 0427  NA 136 138  K 3.6 3.7  CL 103 106  CO2  26 26  GLUCOSE 100* 83  BUN 16 8  CREATININE 0.89 0.82  CALCIUM 8.7* 8.3*  MG  --  1.9    CBG: No results for input(s): "GLUCAP" in the last 168 hours.  Recent Results (from the past 240 hours)  Surgical PCR screen     Status: None   Collection Time: 04/15/24  1:55 AM   Specimen: Nasal Mucosa; Nasal Swab  Result Value Ref Range Status   MRSA, PCR NEGATIVE NEGATIVE Final   Staphylococcus aureus NEGATIVE NEGATIVE Final    Comment: (NOTE) The Xpert SA Assay (FDA approved for NASAL specimens in patients 35 years of age and older), is one component of a comprehensive surveillance program. It is not intended to diagnose infection nor to guide or monitor treatment. Performed at [facility], [address], [place], [place] [address]      Radiology Studies: US  Abdomen Limited RUQ (LIVER/GB) Result Date: 04/14/2024 CLINICAL DATA:  Right-sided abdominal pain EXAM: ULTRASOUND ABDOMEN LIMITED RIGHT UPPER QUADRANT COMPARISON:  CT earlier 04/14/2024 FINDINGS: Gallbladder: Gallbladder is underdistended. Shadowing stones identified. Suggestion of some wall thickening. No reported sonographic Murphy sign. Evaluation is limited by significant overlapping bowel gas and soft tissue. Common bile duct: Diameter: 3 mm Liver: No focal lesion identified. Within normal limits in parenchymal echogenicity. Portal vein is patent on color Doppler imaging with normal direction of blood flow towards the liver. Other: None. IMPRESSION: Limited examination with overlapping bowel gas and soft tissue. Gallbladder has numerous stones. There is suggestion of some wall thickening of the gallbladder. No ductal dilatation reported sonographic Murphy's sign. However with the appearance on the CT scan and the limitations on this ultrasound if there is concern of acute cholecystitis a HIDA scan may be of some benefit to further delineate. Electronically Signed   By: Adrianna Horde M.D.   On: 04/14/2024 11:45   CT ABDOMEN PELVIS W  CONTRAST Result Date: 04/14/2024 CLINICAL DATA:  Right abdominal pain EXAM: CT ABDOMEN AND PELVIS WITH CONTRAST TECHNIQUE: Multidetector CT imaging of the abdomen and pelvis was performed using the standard protocol following bolus administration of intravenous contrast. RADIATION DOSE REDUCTION: This exam was performed according to the departmental dose-optimization program which includes automated exposure control, adjustment of the mA and/or kV according to patient size and/or use of iterative reconstruction technique. CONTRAST:  100mL OMNIPAQUE IOHEXOL 300 MG/ML  SOLN COMPARISON:  None Available. FINDINGS: Lower chest: Lung bases are clear. Hepatobiliary: Liver is within normal limits. Cholelithiasis, including a gallstone in the gallbladder neck (image 26), with mild gallbladder wall thickening/edema (image 38). No convincing pericholecystic inflammatory changes. No intrahepatic or extrahepatic ductal dilatation. Pancreas: Within normal limits. Spleen: Within normal limits Adrenals/Urinary Tract: Adrenal glands are within normal limits. Kidneys with normal is.  No hydronephrosis. Bladder is within normal limits. Stomach/Bowel: Stomach is notable for a small hiatal hernia. No evidence of bowel obstruction. Normal appendix (image 71). No colonic wall thickening or inflammatory changes. Vascular/Lymphatic: No evidence of abdominal aortic aneurysm. No suspicious abdominopelvic lymphadenopathy. Reproductive: Uterus and bilateral ovaries are within normal limits.  Other: Small volume pelvic ascites. Musculoskeletal: Visualized osseous structures are within normal limits. IMPRESSION: Cholelithiasis with mild gallbladder wall thickening/edema. No convincing pericholecystic inflammatory changes. However, this appearance raises concern for early acute cholecystitis. Consider right upper quadrant ultrasound for further evaluation as clinically warranted. Electronically Signed   By: Zadie Herter M.D.   On: 04/14/2024  03:53    Scheduled Meds:  Chlorhexidine Gluconate Cloth  6 each Topical Once   [MAR Hold] heparin  5,000 Units Subcutaneous Q8H   indocyanine green       scopolamine  1 patch Transdermal Once   Continuous Infusions:  sodium chloride 125 mL/hr at 04/14/24 0559   cefoTEtan (CEFOTAN) IV     [MAR Hold] cefTRIAXone (ROCEPHIN)  IV     lactated ringers 10 mL/hr at 04/15/24 1057   [MAR Hold] metronidazole 500 mg (04/15/24 1044)     LOS: 1 day   Time spent: 35 mins  Sha Burling Lincoln Renshaw, MD How to contact the [facility] Attending or Consulting provider 7A - 7P or covering provider during after hours 7P -7A, for this patient?  Check the care team in [facility] and look for a) attending/consulting TRH provider listed and b) the TRH team listed Log into [email or web address] to find provider on call.  Locate the [facility] provider you are looking for under [facility] and page to a number that you can be directly reached. If you still have difficulty reaching the provider, please page the [facility] (Director on Call) for the Hospitalists listed on amion for assistance.  04/15/2024, 12:29 PM

## 2024-04-15 NOTE — Progress Notes (Signed)
 Pt stated she was feeling lower pelvic pressure, unable to void post surgery. Bladder scan showed . MD notified. Awaiting orders.

## 2024-04-15 NOTE — Transfer of Care (Signed)
 Immediate Anesthesia Transfer of Care Note  Patient: Andrea Parrish  Procedure(s) Performed: CHOLECYSTECTOMY, ROBOT-ASSISTED, LAPAROSCOPIC (Abdomen)  Patient Location: PACU  Anesthesia Type:General  Level of Consciousness: awake, alert , and oriented  Airway & Oxygen Therapy: Patient Spontanous Breathing and Patient connected to face mask oxygen  Post-op Assessment: Report given to RN and Post -op Vital signs reviewed and stable  Post vital signs: Reviewed and stable  Last Vitals:  Vitals Value Taken Time  BP 93/41 04/15/24 1336  Temp 36.5 C 04/15/24 1336  Pulse 57 04/15/24 1337  Resp 12 04/15/24 1337  SpO2 96 % 04/15/24 1337  Vitals shown include unfiled device data.  Last Pain:  Vitals:   04/15/24 1018  TempSrc:   PainSc: 0-No pain         Complications: No notable events documented.

## 2024-04-16 DIAGNOSIS — N920 Excessive and frequent menstruation with regular cycle: Secondary | ICD-10-CM | POA: Diagnosis not present

## 2024-04-16 DIAGNOSIS — E8881 Metabolic syndrome: Secondary | ICD-10-CM | POA: Diagnosis not present

## 2024-04-16 DIAGNOSIS — D500 Iron deficiency anemia secondary to blood loss (chronic): Secondary | ICD-10-CM | POA: Diagnosis not present

## 2024-04-16 DIAGNOSIS — K810 Acute cholecystitis: Secondary | ICD-10-CM | POA: Diagnosis not present

## 2024-04-16 LAB — SURGICAL PATHOLOGY

## 2024-04-16 MED ORDER — POLYETHYLENE GLYCOL 3350 17 G PO PACK
17.0000 g | PACK | Freq: Every day | ORAL | 0 refills | Status: AC | PRN
Start: 2024-04-16 — End: ?

## 2024-04-16 MED ORDER — OXYCODONE HCL 5 MG PO TABS: 5.0000 mg | ORAL_TABLET | Freq: Four times a day (QID) | ORAL | 0 refills | Status: AC | PRN

## 2024-04-16 MED ORDER — ACETAMINOPHEN 325 MG PO TABS: 650.0000 mg | ORAL_TABLET | Freq: Four times a day (QID) | ORAL | Status: AC | PRN

## 2024-04-16 NOTE — Plan of Care (Signed)

## 2024-04-16 NOTE — Progress Notes (Signed)
 Went over discharge instructions w/ pt.

## 2024-04-16 NOTE — Discharge Summary (Signed)
 Physician Discharge Summary  Andrea Parrish FAO:[id number] DOB: 1986-09-25 DOA: 04/14/2024  PCP: Cranston Dk, MD Surgeon: Dr. Collene Dawson  Admit date: 04/14/2024 Discharge date: 04/16/2024  Admitted From:  Home  Disposition:  Home   Recommendations for Outpatient Follow-up:  Follow up with PCP as scheduled Postop Follow up with Dr. Collene Dawson as arranged below  Home Health: n/a   Discharge Condition: STABLE   CODE STATUS: FULL DIET: soft foods, advance as tolerated    Brief Hospitalization Summary: Please see all hospital notes, images, labs for full details of the hospitalization. 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  She denied nausea and vomiting symptoms.  Patient denied fever and chills.  She reported that the pain has been constant.  Her labs were unremarkable.  She was sent for CT of the abdomen and pelvis with findings of multiple gallstones and a thickened gallbladder wall consistent with early cholecystitis.  General surgery was consulted who recommended patient be kept n.p.o. started on antibiotics and they will consult.  Hospital Course Narrative Patient was admitted with symptomatic acute cholecystitis symptoms.  She was treated supportively and evaluated by her surgeon Dr. Collene Dawson.  She had an ultrasound of her right upper quadrant.  Decision was made to take her to the operating room for a laparoscopic cholecystectomy which was performed on 04/15/2024.  Patient tolerated it well.  Patient was found to have an edematous gallbladder consistent with cholecystitis.  Her postoperative course was unremarkable.  She was cleared by  surgery to discharge home on 04/16/2024.  She is stable tolerating diet with minimal pain symptoms.  She is discharging home in stable condition.  Postoperative follow-up arrangements with Dr. Collene Dawson via phone call or office visit on 04/29/24.   Discharge Diagnoses:  Principal Problem:   Acute cholecystitis Active Problems:   Irregular heart beat   Ventricular bigeminy   Iron deficiency anemia   Menorrhagia with regular cycle   Anxiety with depression   Metabolic syndrome X   Prediabetes  Discharge Instructions:  Allergies as of 04/16/2024       Reactions   Aspirin Hives   Most over the counter, pill form pain killers.   Ibuprofen Hives   Ciprofloxacin Hives   Nsaids Hives, Swelling   *Only with tablet form, can take GEL form*   Tolmetin Hives, Swelling   *Only with tablet form, can take GEL form*        Medication List     TAKE these medications    acetaminophen 325 MG tablet Commonly known as: TYLENOL Take 2 tablets (650 mg total) by mouth every 6 (six) hours as needed for mild pain (pain score 1-3) or moderate pain (pain score 4-6) (or Fever >/= 101).   buPROPion 150 MG 24 hr tablet Commonly known as: WELLBUTRIN XL Take 1 tablet by mouth every morning.   cetirizine 10 MG tablet Commonly known as: ZYRTEC Take 10 mg by mouth daily as needed for allergies or rhinitis.   diltiazem 120 MG 24 hr capsule Commonly known as: CARDIZEM CD Take 120 mg by mouth daily.   escitalopram 20 MG tablet Commonly known as: LEXAPRO Take 1 tablet by mouth daily.   IRON PO Take 60 mg by mouth in  the morning and at bedtime.   oxyCODONE 5 MG immediate release tablet Commonly known as: Oxy IR/ROXICODONE Take 1 tablet (5 mg total) by mouth every 6 (six) hours as needed for up to 3 days for severe pain (pain score 7-10).   polyethylene glycol 17 g packet Commonly known as: MiraLax Take 17 g by mouth daily as needed for mild constipation.        Follow-up Information      Awilda Bogus, MD Follow up on 04/29/2024.   Specialty: General Surgery Why: phone call post op; if you need to be seen in person call the office Contact information: [address] [place] [facility] [address] [phone]         Cranston Dk, MD Follow up.   Specialty: Family Medicine Why: As needed Contact information: [address] [place] [address] [phone]                Allergies  Allergen Reactions   Aspirin Hives    Most over the counter, pill form pain killers.   Ibuprofen Hives   Ciprofloxacin Hives   Nsaids Hives and Swelling    *Only with tablet form, can take GEL form*   Tolmetin Hives and Swelling    *Only with tablet form, can take GEL form*   Allergies as of 04/16/2024       Reactions   Aspirin Hives   Most over the counter, pill form pain killers.   Ibuprofen Hives   Ciprofloxacin Hives   Nsaids Hives, Swelling   *Only with tablet form, can take GEL form*   Tolmetin Hives, Swelling   *Only with tablet form, can take GEL form*        Medication List     TAKE these medications    acetaminophen 325 MG tablet Commonly known as: TYLENOL Take 2 tablets (650 mg total) by mouth every 6 (six) hours as needed for mild pain (pain score 1-3) or moderate pain (pain score 4-6) (or Fever >/= 101).   buPROPion 150 MG 24 hr tablet Commonly known as: WELLBUTRIN XL Take 1 tablet by mouth every morning.   cetirizine 10 MG tablet Commonly known as: ZYRTEC Take 10 mg by mouth daily as needed for allergies or rhinitis.   diltiazem 120 MG 24 hr capsule Commonly known as: CARDIZEM CD Take 120 mg by mouth daily.   escitalopram 20 MG tablet Commonly known as: LEXAPRO Take 1 tablet by mouth daily.   IRON PO Take 60 mg by mouth in the morning and at bedtime.   oxyCODONE 5 MG immediate release tablet Commonly known as: Oxy IR/ROXICODONE Take 1 tablet (5 mg total) by mouth every 6 (six) hours as needed for up to 3 days for severe pain  (pain score 7-10).   polyethylene glycol 17 g packet Commonly known as: MiraLax Take 17 g by mouth daily as needed for mild constipation.        Procedures/Studies: US  Abdomen Limited RUQ (LIVER/GB) Result Date: 04/14/2024 CLINICAL DATA:  Right-sided abdominal pain EXAM: ULTRASOUND ABDOMEN LIMITED RIGHT UPPER QUADRANT COMPARISON:  CT earlier 04/14/2024 FINDINGS: Gallbladder: Gallbladder is underdistended. Shadowing stones identified. Suggestion of some wall thickening. No reported sonographic Murphy sign. Evaluation is limited by significant overlapping bowel gas and soft tissue. Common bile duct: Diameter: 3 mm Liver: No focal lesion identified. Within normal limits in parenchymal echogenicity. Portal vein is patent on color Doppler imaging with normal direction of blood flow towards the liver. Other: None. IMPRESSION:  Limited examination with overlapping bowel gas and soft tissue. Gallbladder has numerous stones. There is suggestion of some wall thickening of the gallbladder. No ductal dilatation reported sonographic Murphy's sign. However with the appearance on the CT scan and the limitations on this ultrasound if there is concern of acute cholecystitis a HIDA scan may be of some benefit to further delineate. Electronically Signed   By: Adrianna Horde M.D.   On: 04/14/2024 11:45   CT ABDOMEN PELVIS W CONTRAST Result Date: 04/14/2024 CLINICAL DATA:  Right abdominal pain EXAM: CT ABDOMEN AND PELVIS WITH CONTRAST TECHNIQUE: Multidetector CT imaging of the abdomen and pelvis was performed using the standard protocol following bolus administration of intravenous contrast. RADIATION DOSE REDUCTION: This exam was performed according to the departmental dose-optimization program which includes automated exposure control, adjustment of the mA and/or kV according to patient size and/or use of iterative reconstruction technique. CONTRAST:  100mL OMNIPAQUE IOHEXOL 300 MG/ML  SOLN COMPARISON:  None Available.  FINDINGS: Lower chest: Lung bases are clear. Hepatobiliary: Liver is within normal limits. Cholelithiasis, including a gallstone in the gallbladder neck (image 26), with mild gallbladder wall thickening/edema (image 38). No convincing pericholecystic inflammatory changes. No intrahepatic or extrahepatic ductal dilatation. Pancreas: Within normal limits. Spleen: Within normal limits Adrenals/Urinary Tract: Adrenal glands are within normal limits. Kidneys with normal is.  No hydronephrosis. Bladder is within normal limits. Stomach/Bowel: Stomach is notable for a small hiatal hernia. No evidence of bowel obstruction. Normal appendix (image 71). No colonic wall thickening or inflammatory changes. Vascular/Lymphatic: No evidence of abdominal aortic aneurysm. No suspicious abdominopelvic lymphadenopathy. Reproductive: Uterus and bilateral ovaries are within normal limits. Other: Small volume pelvic ascites. Musculoskeletal: Visualized osseous structures are within normal limits. IMPRESSION: Cholelithiasis with mild gallbladder wall thickening/edema. No convincing pericholecystic inflammatory changes. However, this appearance raises concern for early acute cholecystitis. Consider right upper quadrant ultrasound for further evaluation as clinically warranted. Electronically Signed   By: Zadie Herter M.D.   On: 04/14/2024 03:53     Subjective: Pt say she feels well, she is tolerating her diet and her pain is minimal, she is eager to go home today.   Discharge Exam: Vitals:   04/15/24 2022 04/16/24 0312  BP: (!) 98/48 97/62  Pulse: 77 (!) 54  Resp: 18 18  Temp: 98.6 F (37 C) 98.6 F (37 C)  SpO2: 96% 99%   Vitals:   04/15/24 1430 04/15/24 1451 04/15/24 2022 04/16/24 0312  BP: (!) 91/48 (!) 95/54 (!) 98/48 97/62  Pulse: (!) 54 (!) 55 77 (!) 54  Resp: 14 16 18 18   Temp: 97.6 F (36.4 C) 97.6 F (36.4 C) 98.6 F (37 C) 98.6 F (37 C)  TempSrc:  Oral Oral Oral  SpO2: 97% 95% 96% 99%  Weight:       Height:       General: Pt is alert, awake, not in acute distress Cardiovascular: RRR, S1/S2 +, no rubs, no gallops Respiratory: CTA bilaterally, no wheezing, no rhonchi Abdominal: Soft, NT, ND, bowel sounds + Extremities: no edema, no cyanosis   The results of significant diagnostics from this hospitalization (including imaging, microbiology, ancillary and laboratory) are listed below for reference.     Microbiology: Recent Results (from the past 240 hours)  Surgical PCR screen     Status: None   Collection Time: 04/15/24  1:55 AM   Specimen: Nasal Mucosa; Nasal Swab  Result Value Ref Range Status   MRSA, PCR NEGATIVE NEGATIVE Final   Staphylococcus aureus  NEGATIVE NEGATIVE Final    Comment: (NOTE) The Xpert SA Assay (FDA approved for NASAL specimens in patients 15 years of age and older), is one component of a comprehensive surveillance program. It is not intended to diagnose infection nor to guide or monitor treatment. Performed at [facility], [address] [place], [place] [address]      Labs: BNP (last 3 results) No results for input(s): "BNP" in the last 8760 hours. Basic Metabolic Panel: Recent Labs  Lab 04/14/24 0243 04/15/24 0427  NA 136 138  K 3.6 3.7  CL 103 106  CO2 26 26  GLUCOSE 100* 83  BUN 16 8  CREATININE 0.89 0.82  CALCIUM 8.7* 8.3*  MG  --  1.9   Liver Function Tests: Recent Labs  Lab 04/14/24 0243 04/15/24 0427  AST 17 13*  ALT 13 12  ALKPHOS 57 50  BILITOT 0.4 0.6  PROT 6.9 6.0*  ALBUMIN 3.3* 2.8*   Recent Labs  Lab 04/14/24 0243  LIPASE 30   No results for input(s): "AMMONIA" in the last 168 hours. CBC: Recent Labs  Lab 04/14/24 0243 04/15/24 0427  WBC 9.1 7.4  HGB 11.6* 11.1*  HCT 38.3 37.9  MCV 79.5* 80.6  PLT 260 231   Cardiac Enzymes: No results for input(s): "CKTOTAL", "CKMB", "CKMBINDEX", "TROPONINI" in the last 168 hours. BNP: Invalid input(s): "POCBNP" CBG: No results for input(s): "GLUCAP" in the last  168 hours. D-Dimer No results for input(s): "DDIMER" in the last 72 hours. Hgb A1c Recent Labs    04/14/24 0813  HGBA1C 4.8   Lipid Profile Recent Labs    04/14/24 0600  CHOL 219*  HDL 48  LDLCALC 157*  TRIG 68  CHOLHDL 4.6   Thyroid function studies No results for input(s): "TSH", "T4TOTAL", "T3FREE", "THYROIDAB" in the last 72 hours.  Invalid input(s): "FREET3" Anemia work up Recent Labs    04/14/24 0813  FERRITIN 4*  TIBC 395  IRON 18*  RETICCTPCT 1.7   Urinalysis    Component Value Date/Time   COLORURINE STRAW (A) 04/14/2024 0243   APPEARANCEUR CLEAR 04/14/2024 0243   LABSPEC 1.013 04/14/2024 0243   PHURINE 5.0 04/14/2024 0243   GLUCOSEU NEGATIVE 04/14/2024 0243   HGBUR NEGATIVE 04/14/2024 0243   BILIRUBINUR NEGATIVE 04/14/2024 0243   KETONESUR NEGATIVE 04/14/2024 0243   PROTEINUR NEGATIVE 04/14/2024 0243   UROBILINOGEN 0.2 08/27/2013 1845   NITRITE NEGATIVE 04/14/2024 0243   LEUKOCYTESUR SMALL (A) 04/14/2024 0243   Sepsis Labs Recent Labs  Lab 04/14/24 0243 04/15/24 0427  WBC 9.1 7.4   Microbiology Recent Results (from the past 240 hours)  Surgical PCR screen     Status: None   Collection Time: 04/15/24  1:55 AM   Specimen: Nasal Mucosa; Nasal Swab  Result Value Ref Range Status   MRSA, PCR NEGATIVE NEGATIVE Final   Staphylococcus aureus NEGATIVE NEGATIVE Final    Comment: (NOTE) The Xpert SA Assay (FDA approved for NASAL specimens in patients 8 years of age and older), is one component of a comprehensive surveillance program. It is not intended to diagnose infection nor to guide or monitor treatment. Performed at [facility], [address] [place], [place] [address]    Time coordinating discharge:  28 mins   SIGNED:  Faustino Hook, MD  [facility] 04/16/2024, 10:04 AM How to contact the [facility] Attending or Consulting provider 7A - 7P or covering provider during after hours 7P -7A, for this patient?  Check the care team in  [facility] and look for a) attending/consulting [facility] provider listed and b) the TR[facility] team listed Log into [email or web address] and use [facility] universal password to access. If you do not have the password, please contact the hospital operator. Locate the [facility] provider you are looking for under [facility] and page to a number that you can be directly reached. If you still have difficulty reaching the provider, please page the [facility] (Director on Call) for the Hospitalists listed on amion for assistance.

## 2024-04-29 ENCOUNTER — Ambulatory Visit (INDEPENDENT_AMBULATORY_CARE_PROVIDER_SITE_OTHER): Admitting: General Surgery

## 2024-04-29 DIAGNOSIS — K810 Acute cholecystitis: Secondary | ICD-10-CM

## 2024-04-29 NOTE — Progress Notes (Signed)
 [facility]  I am calling the patient for post operative evaluation. This is not a billable encounter as it is under the global charges for the surgery.  The patient had a robotic cholecystectomy on 4/29. The patient reports that she is doing ok. The are tolerating a diet, having good pain control, and having regular Bms.  The incisions are healing but having soreness pain on the left side. The patient has no concerns.   Pathology:  Acute and chronic cholecystitis with cholelithiasis   Will see the patient PRN.  Diet and activity as tolerated.   Deena Farrier, MD [facility] [address] Anise Barlow [place], [place] [address] [phone] (office)
